# Patient Record
Sex: Female | Born: 1955 | Race: Black or African American | Hispanic: No | Marital: Single | Smoking: Never smoker
Health system: Southern US, Community
[De-identification: ages and names within clinical notes are randomized; demographics above are authoritative.]

## PROBLEM LIST (undated history)

## (undated) DIAGNOSIS — E785 Hyperlipidemia, unspecified: Secondary | ICD-10-CM

## (undated) DIAGNOSIS — I10 Essential (primary) hypertension: Secondary | ICD-10-CM

## (undated) HISTORY — PX: WISDOM TOOTH EXTRACTION: SHX21

## (undated) HISTORY — PX: REDUCTION MAMMAPLASTY: SUR839

## (undated) HISTORY — DX: Essential (primary) hypertension: I10

## (undated) HISTORY — DX: Hyperlipidemia, unspecified: E78.5

---

## 2005-01-25 ENCOUNTER — Ambulatory Visit: Payer: Self-pay | Admitting: Family Medicine

## 2005-01-29 ENCOUNTER — Ambulatory Visit: Payer: Self-pay | Admitting: Family Medicine

## 2007-06-05 ENCOUNTER — Ambulatory Visit: Payer: Self-pay | Admitting: Family Medicine

## 2008-09-05 ENCOUNTER — Ambulatory Visit: Payer: Self-pay | Admitting: Family Medicine

## 2008-11-22 ENCOUNTER — Ambulatory Visit: Payer: Self-pay | Admitting: Gastroenterology

## 2010-09-15 ENCOUNTER — Ambulatory Visit: Payer: Self-pay | Admitting: Family Medicine

## 2012-03-08 ENCOUNTER — Ambulatory Visit: Payer: Self-pay | Admitting: Family Medicine

## 2014-06-06 ENCOUNTER — Ambulatory Visit: Payer: Self-pay

## 2016-08-04 ENCOUNTER — Other Ambulatory Visit: Payer: Self-pay | Admitting: Family Medicine

## 2016-08-04 DIAGNOSIS — R221 Localized swelling, mass and lump, neck: Secondary | ICD-10-CM

## 2016-08-09 ENCOUNTER — Ambulatory Visit
Admission: RE | Admit: 2016-08-09 | Discharge: 2016-08-09 | Disposition: A | Discharge status: Home / Self Care | Payer: 59 | Source: Ambulatory Visit | Attending: Family Medicine | Admitting: Family Medicine

## 2016-08-09 DIAGNOSIS — R221 Localized swelling, mass and lump, neck: Secondary | ICD-10-CM

## 2016-08-09 DIAGNOSIS — E042 Nontoxic multinodular goiter: Secondary | ICD-10-CM | POA: Insufficient documentation

## 2016-09-16 ENCOUNTER — Other Ambulatory Visit: Payer: Self-pay | Admitting: Otolaryngology

## 2016-09-16 DIAGNOSIS — E041 Nontoxic single thyroid nodule: Secondary | ICD-10-CM

## 2016-09-21 ENCOUNTER — Ambulatory Visit
Admission: RE | Admit: 2016-09-21 | Discharge: 2016-09-21 | Disposition: A | Discharge status: Home / Self Care | Payer: 59 | Source: Ambulatory Visit | Attending: Otolaryngology | Admitting: Otolaryngology

## 2016-09-21 ENCOUNTER — Ambulatory Visit: Admission: RE | Admit: 2016-09-21 | Payer: 59 | Source: Ambulatory Visit

## 2016-09-21 DIAGNOSIS — I2510 Atherosclerotic heart disease of native coronary artery without angina pectoris: Secondary | ICD-10-CM | POA: Insufficient documentation

## 2016-09-21 DIAGNOSIS — E041 Nontoxic single thyroid nodule: Secondary | ICD-10-CM | POA: Diagnosis present

## 2016-09-27 ENCOUNTER — Inpatient Hospital Stay
Admission: RE | Admit: 2016-09-27 | Discharge: 2016-09-27 | Disposition: A | Discharge status: Home / Self Care | Payer: 59 | Source: Ambulatory Visit

## 2016-09-28 ENCOUNTER — Encounter: Payer: Self-pay | Admitting: *Deleted

## 2016-09-28 NOTE — Patient Instructions (Signed)
  Your procedure is scheduled on: 10-04-16 [facility]) Report to Same Day Surgery 2nd floor medical mall [facility] Entrance-take elevator on left to 2nd floor.  Check in with surgery information desk.) To find out your arrival time please call [phone] between 1PM - 3PM on 10-01-16 (FRIDAY)  Remember: Instructions that are not followed completely may result in serious medical risk, up to and including death, or upon the discretion of your surgeon and anesthesiologist your surgery may need to be rescheduled.    _x___ 1. Do not eat food or drink liquids after midnight. No gum chewing or hard candies.     __x__ 2. No Alcohol for 24 hours before or after surgery.   __x__3. No Smoking for 24 prior to surgery.   ____  4. Bring all medications with you on the day of surgery if instructed.    __x__ 5. Notify your doctor if there is any change in your medical condition     (cold, fever, infections).     Do not wear jewelry, make-up, hairpins, clips or nail polish.  Do not wear lotions, powders, or perfumes. You may wear deodorant.  Do not shave 48 hours prior to surgery. Men may shave face and neck.  Do not bring valuables to the hospital.    [facility] is not responsible for any belongings or valuables.               Contacts, dentures or bridgework may not be worn into surgery.  Leave your suitcase in the car. After surgery it may be brought to your room.  For patients admitted to the hospital, discharge time is determined by your treatment team.   Patients discharged the day of surgery will not be allowed to drive home.  You will need someone to drive you home and stay with you the night of your procedure.    Please read over the following fact sheets that you were given:   [facility] Preparing for Surgery and or MRSA Information   ____ Take these medicines the morning of surgery with A SIP OF WATER:    1. NONE  2.  3.  4.  5.  6.  ____Fleets enema or Magnesium Citrate as  directed.   _x___ Use CHG Soap or sage wipes as directed on instruction sheet   ____ Use inhalers on the day of surgery and bring to hospital day of surgery  ____ Stop metformin 2 days prior to surgery    ____ Take 1/2 of usual insulin dose the night before surgery and none on the morning of  surgery.   ____ Stop Aspirin, Coumadin, Pllavix ,Eliquis, Effient, or Pradaxa  x__ Stop Anti-inflammatories such as Advil, Aleve, Ibuprofen, Motrin, Naproxen,          Naprosyn, Goodies powders or aspirin products NOW- Ok to take Tylenol.   _X___ Stop supplements until after surgery-STOP BIOTIN AND COL LIVER OIL NOW  ____ Bring C-Pap to the hospital.

## 2016-09-30 ENCOUNTER — Encounter
Admission: RE | Admit: 2016-09-30 | Discharge: 2016-09-30 | Disposition: A | Discharge status: Home / Self Care | Payer: 59 | Source: Ambulatory Visit | Attending: Otolaryngology | Admitting: Otolaryngology

## 2016-09-30 DIAGNOSIS — E041 Nontoxic single thyroid nodule: Secondary | ICD-10-CM | POA: Insufficient documentation

## 2016-09-30 DIAGNOSIS — Z01812 Encounter for preprocedural laboratory examination: Secondary | ICD-10-CM | POA: Insufficient documentation

## 2016-09-30 LAB — BASIC METABOLIC PANEL
Anion gap: 8 (ref 5–15)
BUN: 10 mg/dL (ref 6–20)
CALCIUM: 9.4 mg/dL (ref 8.9–10.3)
CO2: 26 mmol/L (ref 22–32)
CREATININE: 0.79 mg/dL (ref 0.44–1.00)
Chloride: 108 mmol/L (ref 101–111)
GFR calc Af Amer: >60 mL/min (ref >60)
Glucose, Bld: 104 mg/dL — ABNORMAL HIGH (ref 65–99)
POTASSIUM: 3.8 mmol/L (ref 3.5–5.1)
SODIUM: 142 mmol/L (ref 135–145)

## 2016-10-04 ENCOUNTER — Observation Stay
Admission: RE | Admit: 2016-10-04 | Discharge: 2016-10-05 | Disposition: A | Discharge status: Home / Self Care | Payer: 59 | Source: Ambulatory Visit | Attending: Otolaryngology | Admitting: Otolaryngology

## 2016-10-04 ENCOUNTER — Encounter: Payer: Self-pay | Admitting: *Deleted

## 2016-10-04 ENCOUNTER — Encounter
Admission: RE | Disposition: A | Discharge status: Home / Self Care | Payer: Self-pay | Source: Ambulatory Visit | Attending: Otolaryngology

## 2016-10-04 ENCOUNTER — Ambulatory Visit: Payer: 59 | Admitting: Anesthesiology

## 2016-10-04 DIAGNOSIS — E042 Nontoxic multinodular goiter: Secondary | ICD-10-CM | POA: Insufficient documentation

## 2016-10-04 DIAGNOSIS — Z79899 Other long term (current) drug therapy: Secondary | ICD-10-CM | POA: Insufficient documentation

## 2016-10-04 DIAGNOSIS — E041 Nontoxic single thyroid nodule: Secondary | ICD-10-CM | POA: Diagnosis present

## 2016-10-04 DIAGNOSIS — Z9889 Other specified postprocedural states: Secondary | ICD-10-CM

## 2016-10-04 DIAGNOSIS — Z9089 Acquired absence of other organs: Secondary | ICD-10-CM

## 2016-10-04 DIAGNOSIS — E890 Postprocedural hypothyroidism: Secondary | ICD-10-CM

## 2016-10-04 DIAGNOSIS — C73 Malignant neoplasm of thyroid gland: Secondary | ICD-10-CM | POA: Diagnosis not present

## 2016-10-04 HISTORY — PX: THYROIDECTOMY: SHX17

## 2016-10-04 SURGERY — THYROIDECTOMY
Anesthesia: General

## 2016-10-04 MED ORDER — FENTANYL CITRATE (PF) 100 MCG/2ML IJ SOLN
INTRAMUSCULAR | Status: AC
  Filled 2016-10-04: qty 2

## 2016-10-04 MED ORDER — LIDOCAINE HCL (PF) 1 % IJ SOLN
INTRAMUSCULAR | Status: AC
  Filled 2016-10-04: qty 30

## 2016-10-04 MED ORDER — ONDANSETRON 4 MG PO TBDP: 4.0000 mg | ORAL_TABLET | Freq: Four times a day (QID) | ORAL | Status: DC | PRN

## 2016-10-04 MED ORDER — FAMOTIDINE 20 MG PO TABS
ORAL_TABLET | ORAL | Status: AC
  Administered 2016-10-04: 20 mg via ORAL
  Filled 2016-10-04: qty 1

## 2016-10-04 MED ORDER — PROPOFOL 500 MG/50ML IV EMUL
INTRAVENOUS | Status: DC | PRN
  Administered 2016-10-04: 50 ug/kg/min via INTRAVENOUS

## 2016-10-04 MED ORDER — MIDAZOLAM HCL 2 MG/2ML IJ SOLN
INTRAMUSCULAR | Status: DC | PRN
  Administered 2016-10-04: 2 mg via INTRAVENOUS

## 2016-10-04 MED ORDER — FENTANYL CITRATE (PF) 100 MCG/2ML IJ SOLN
INTRAMUSCULAR | Status: DC | PRN
  Administered 2016-10-04: 25 ug via INTRAVENOUS
  Administered 2016-10-04: 100 ug via INTRAVENOUS
  Administered 2016-10-04: 50 ug via INTRAVENOUS
  Administered 2016-10-04: 100 ug via INTRAVENOUS

## 2016-10-04 MED ORDER — PHENYLEPHRINE 40 MCG/ML (10ML) SYRINGE FOR IV PUSH (FOR BLOOD PRESSURE SUPPORT)
PREFILLED_SYRINGE | INTRAVENOUS | Status: AC
  Filled 2016-10-04: qty 10

## 2016-10-04 MED ORDER — PROPOFOL 10 MG/ML IV BOLUS
INTRAVENOUS | Status: AC
  Filled 2016-10-04: qty 40

## 2016-10-04 MED ORDER — FLEET ENEMA 7-19 GM/118ML RE ENEM: 1.0000 | ENEMA | Freq: Once | RECTAL | Status: DC | PRN

## 2016-10-04 MED ORDER — ACETAMINOPHEN 650 MG RE SUPP: 650.0000 mg | Freq: Four times a day (QID) | RECTAL | Status: DC | PRN

## 2016-10-04 MED ORDER — PROPOFOL 10 MG/ML IV BOLUS
INTRAVENOUS | Status: AC
  Filled 2016-10-04: qty 20

## 2016-10-04 MED ORDER — DEXAMETHASONE SODIUM PHOSPHATE 10 MG/ML IJ SOLN
INTRAMUSCULAR | Status: AC
  Filled 2016-10-04: qty 1

## 2016-10-04 MED ORDER — DEXAMETHASONE SODIUM PHOSPHATE 10 MG/ML IJ SOLN
INTRAMUSCULAR | Status: DC | PRN
  Administered 2016-10-04: 10 mg via INTRAVENOUS

## 2016-10-04 MED ORDER — ONDANSETRON HCL 4 MG/2ML IJ SOLN: 4.0000 mg | Freq: Once | INTRAMUSCULAR | Status: DC | PRN

## 2016-10-04 MED ORDER — REMIFENTANIL HCL 1 MG IV SOLR
INTRAVENOUS | Status: AC
  Filled 2016-10-04: qty 1000

## 2016-10-04 MED ORDER — ONDANSETRON HCL 4 MG/2ML IJ SOLN: 4.0000 mg | Freq: Four times a day (QID) | INTRAMUSCULAR | Status: DC | PRN

## 2016-10-04 MED ORDER — BACITRACIN 500 UNIT/GM EX OINT
TOPICAL_OINTMENT | CUTANEOUS | Status: DC | PRN
  Administered 2016-10-04: 1 via TOPICAL

## 2016-10-04 MED ORDER — LIDOCAINE HCL (CARDIAC) 20 MG/ML IV SOLN
INTRAVENOUS | Status: DC | PRN
Start: 2016-10-04 — End: 2016-10-04
  Administered 2016-10-04: 80 mg via INTRAVENOUS

## 2016-10-04 MED ORDER — HYDROCODONE-ACETAMINOPHEN 5-325 MG PO TABS: 1.0000 | ORAL_TABLET | ORAL | Status: DC | PRN

## 2016-10-04 MED ORDER — FENTANYL CITRATE (PF) 250 MCG/5ML IJ SOLN
INTRAMUSCULAR | Status: AC
  Filled 2016-10-04: qty 5

## 2016-10-04 MED ORDER — ATORVASTATIN CALCIUM 20 MG PO TABS
20.0000 mg | ORAL_TABLET | Freq: Every day | ORAL | Status: DC
  Administered 2016-10-04: 20 mg via ORAL
  Filled 2016-10-04: qty 1

## 2016-10-04 MED ORDER — ONDANSETRON HCL 4 MG/2ML IJ SOLN
INTRAMUSCULAR | Status: DC | PRN
  Administered 2016-10-04: 4 mg via INTRAVENOUS

## 2016-10-04 MED ORDER — FENTANYL CITRATE (PF) 100 MCG/2ML IJ SOLN
25.0000 ug | INTRAMUSCULAR | Status: DC | PRN
  Administered 2016-10-04: 25 ug via INTRAVENOUS

## 2016-10-04 MED ORDER — SODIUM CHLORIDE 0.9 % IJ SOLN
INTRAMUSCULAR | Status: AC
  Filled 2016-10-04: qty 20

## 2016-10-04 MED ORDER — PROPOFOL 10 MG/ML IV BOLUS
INTRAVENOUS | Status: AC
Start: 2016-10-04 — End: 2016-10-04
  Filled 2016-10-04: qty 20

## 2016-10-04 MED ORDER — FAMOTIDINE 20 MG PO TABS
20.0000 mg | ORAL_TABLET | Freq: Once | ORAL | Status: AC
  Administered 2016-10-04: 20 mg via ORAL

## 2016-10-04 MED ORDER — LACTATED RINGERS IV SOLN
INTRAVENOUS | Status: DC
  Administered 2016-10-04: 07:00:00 via INTRAVENOUS

## 2016-10-04 MED ORDER — BACITRACIN ZINC 500 UNIT/GM EX OINT
TOPICAL_OINTMENT | CUTANEOUS | Status: AC
  Filled 2016-10-04: qty 28.35

## 2016-10-04 MED ORDER — LIDOCAINE HCL (PF) 2 % IJ SOLN
INTRAMUSCULAR | Status: AC
  Filled 2016-10-04: qty 2

## 2016-10-04 MED ORDER — EPINEPHRINE PF 1 MG/ML IJ SOLN
INTRAMUSCULAR | Status: AC
  Filled 2016-10-04: qty 1

## 2016-10-04 MED ORDER — FENTANYL CITRATE (PF) 100 MCG/2ML IJ SOLN
INTRAMUSCULAR | Status: AC
  Administered 2016-10-04: 25 ug via INTRAVENOUS
  Filled 2016-10-04: qty 2

## 2016-10-04 MED ORDER — REMIFENTANIL HCL 1 MG IV SOLR
INTRAVENOUS | Status: DC | PRN
  Administered 2016-10-04: .08 ug/kg/min via INTRAVENOUS

## 2016-10-04 MED ORDER — ACETAMINOPHEN 325 MG PO TABS: 650.0000 mg | ORAL_TABLET | Freq: Four times a day (QID) | ORAL | Status: DC | PRN

## 2016-10-04 MED ORDER — DEXTROSE-NACL 5-0.45 % IV SOLN
INTRAVENOUS | Status: DC
  Administered 2016-10-04 (×2): via INTRAVENOUS

## 2016-10-04 MED ORDER — ONDANSETRON HCL 4 MG/2ML IJ SOLN
INTRAMUSCULAR | Status: AC
  Filled 2016-10-04: qty 2

## 2016-10-04 MED ORDER — MORPHINE SULFATE (PF) 4 MG/ML IV SOLN: 3.0000 mg | INTRAVENOUS | Status: DC | PRN

## 2016-10-04 MED ORDER — PROPOFOL 10 MG/ML IV BOLUS
INTRAVENOUS | Status: DC | PRN
  Administered 2016-10-04: 150 mg via INTRAVENOUS
  Administered 2016-10-04: 20 mg via INTRAVENOUS
  Administered 2016-10-04 (×2): 30 mg via INTRAVENOUS

## 2016-10-04 MED ORDER — SUCCINYLCHOLINE CHLORIDE 20 MG/ML IJ SOLN
INTRAMUSCULAR | Status: DC | PRN
  Administered 2016-10-04: 100 mg via INTRAVENOUS

## 2016-10-04 MED ORDER — MAGNESIUM HYDROXIDE 400 MG/5ML PO SUSP: 30.0000 mL | Freq: Every day | ORAL | Status: DC | PRN

## 2016-10-04 MED ORDER — MIDAZOLAM HCL 2 MG/2ML IJ SOLN
INTRAMUSCULAR | Status: AC
  Filled 2016-10-04: qty 2

## 2016-10-04 MED ORDER — BISACODYL 5 MG PO TBEC: 5.0000 mg | DELAYED_RELEASE_TABLET | Freq: Every day | ORAL | Status: DC | PRN

## 2016-10-04 MED ORDER — SUCCINYLCHOLINE CHLORIDE 200 MG/10ML IV SOSY
PREFILLED_SYRINGE | INTRAVENOUS | Status: AC
Start: 2016-10-04 — End: 2016-10-04
  Filled 2016-10-04: qty 10

## 2016-10-04 MED ORDER — PHENYLEPHRINE HCL 10 MG/ML IJ SOLN
INTRAMUSCULAR | Status: DC | PRN
  Administered 2016-10-04: 80 ug via INTRAVENOUS

## 2016-10-04 SURGICAL SUPPLY — 39 items
BLADE SURG 15 STRL LF DISP TIS (BLADE) ×2 IMPLANT
BLADE SURG 15 STRL SS (BLADE) ×4
CANISTER SUCT 1200ML W/VALVE (MISCELLANEOUS) ×3 IMPLANT
CORD BIP STRL DISP 12FT (MISCELLANEOUS) ×3 IMPLANT
DRAIN TLS ROUND 10FR (DRAIN) IMPLANT
DRAPE MAG INST 16X20 L/F (DRAPES) ×3 IMPLANT
DRSG TEGADERM 2-3/8X2-3/4 SM (GAUZE/BANDAGES/DRESSINGS) ×3 IMPLANT
DRSG TEGADERM 4X4.75 (GAUZE/BANDAGES/DRESSINGS) ×3 IMPLANT
DRSG TELFA 3X8 NADH (GAUZE/BANDAGES/DRESSINGS) ×3 IMPLANT
ELECT LARYNGEAL 6/7 (MISCELLANEOUS)
ELECT LARYNGEAL 8/9 (MISCELLANEOUS)
ELECT LARYNGEAL DUAL CHAN (ELECTRODE) ×3 IMPLANT
ELECT REM PT RETURN 9FT ADLT (ELECTROSURGICAL) ×3
ELECTRODE LARYNGEAL 6/7 (MISCELLANEOUS) IMPLANT
ELECTRODE LARYNGEAL 8/9 (MISCELLANEOUS) IMPLANT
ELECTRODE REM PT RTRN 9FT ADLT (ELECTROSURGICAL) ×1 IMPLANT
FORCEPS JEWEL BIP 4-3/4 STR (INSTRUMENTS) ×3 IMPLANT
GLOVE BIO SURGEON STRL SZ7.5 (GLOVE) ×9 IMPLANT
GLOVE PROTEXIS LATEX SZ 7.5 (GLOVE) ×3 IMPLANT
GOWN STRL REUS W/ TWL LRG LVL3 (GOWN DISPOSABLE) ×3 IMPLANT
GOWN STRL REUS W/TWL LRG LVL3 (GOWN DISPOSABLE) ×6
HARMONIC SCALPEL FOCUS (MISCELLANEOUS) ×3 IMPLANT
HEMOSTAT SURGICEL 2X3 (HEMOSTASIS) ×3 IMPLANT
HOOK STAY BLUNT/RETRACTOR 5M (MISCELLANEOUS) ×3 IMPLANT
LABEL OR SOLS (LABEL) IMPLANT
LARYNGEAL SURFACE ELECTRODE ×2 IMPLANT
NS IRRIG 500ML POUR BTL (IV SOLUTION) ×3 IMPLANT
PACK HEAD/NECK (MISCELLANEOUS) ×3 IMPLANT
PROBE NEUROSIGN BIPOL (MISCELLANEOUS) ×1 IMPLANT
PROBE NEUROSIGN BIPOLAR (MISCELLANEOUS) ×2
SPONGE KITTNER 5P (MISCELLANEOUS) ×3 IMPLANT
SPONGE XRAY 4X4 16PLY STRL (MISCELLANEOUS) ×3 IMPLANT
STRAP SAFETY BODY (MISCELLANEOUS) ×3 IMPLANT
SUT ETHILON 6 0 9-3 1X18 BLK (SUTURE) ×3 IMPLANT
SUT PROLENE 6 0 P 1 18 (SUTURE) ×3 IMPLANT
SUT SILK 2 0 (SUTURE)
SUT SILK 2-0 18XBRD TIE 12 (SUTURE) IMPLANT
SUT VIC AB 4-0 RB1 18 (SUTURE) IMPLANT
SYSTEM CHEST DRAIN TLS 7FR (DRAIN) ×9 IMPLANT

## 2016-10-04 NOTE — H&P (Signed)
  H&P has been reviewed and no changes necessary. To be downloaded later. 

## 2016-10-04 NOTE — Progress Notes (Signed)
10/04/2016 6:05pm  S: A little sore in neck, but otherwise feeling ok O: Neck wound looks dry, with no swelling. Voice clear, no SOB.       Drains ok, Swallowing liquids well.  A: Doing well postop. P: Will change vacutainers now. Will get some pain meds to her.       Wll see in am to pull drain and d/c home.   Krystal Bennett

## 2016-10-04 NOTE — Anesthesia Post-op Follow-up Note (Cosign Needed)
Anesthesia QCDR form completed.        

## 2016-10-04 NOTE — Transfer of Care (Signed)
Immediate Anesthesia Transfer of Care Note  Patient: Krystal Bennett  Procedure(s) Performed: Procedure(s): THYROIDECTOMY (N/A)  Patient Location: PACU  Anesthesia Type:General  Level of Consciousness: awake and patient cooperative  Airway & Oxygen Therapy: Patient Spontanous Breathing and Patient connected to face mask oxygen  Post-op Assessment: Report given to RN, Post -op Vital signs reviewed and stable and Patient moving all extremities X 4  Post vital signs: Reviewed and stable  Last Vitals:  Vitals:   10/04/16 0608 10/04/16 1004  BP: (!) 197/99 129/78  Pulse: 87 91  Resp: 18 (!) 6  Temp: 36.2 C 36.4 C    Last Pain:  Vitals:   10/04/16 0608  TempSrc: Tympanic         Complications: No apparent anesthesia complications

## 2016-10-04 NOTE — Anesthesia Procedure Notes (Signed)
Procedure Name: Intubation Date/Time: 10/04/2016 7:28 AM Performed by: Hedda Slade Pre-anesthesia Checklist: Patient identified, Emergency Drugs available, Suction available and Patient being monitored Patient Re-evaluated:Patient Re-evaluated prior to inductionOxygen Delivery Method: Circle system utilized Preoxygenation: Pre-oxygenation with 100% oxygen Intubation Type: IV induction Ventilation: Mask ventilation without difficulty Laryngoscope Size: Glidescope Grade View: Grade I Tube type: Oral Tube size: 7.0 mm Number of attempts: 1 Placement Confirmation: ETT inserted through vocal cords under direct vision,  positive ETCO2 and breath sounds checked- equal and bilateral Secured at: 20 cm Tube secured with: Tape

## 2016-10-04 NOTE — Anesthesia Preprocedure Evaluation (Signed)
Anesthesia Evaluation  Patient identified by MRN, date of birth, ID band Patient awake    Reviewed: Allergy & Precautions, NPO status , Patient's Chart, lab work & pertinent test results  Airway Mallampati: III  TM Distance: <3 FB     Dental  (+) Caps, Chipped   Pulmonary neg pulmonary ROS,    Pulmonary exam normal        Cardiovascular negative cardio ROS Normal cardiovascular exam     Neuro/Psych negative neurological ROS  negative psych ROS   GI/Hepatic negative GI ROS, Neg liver ROS,   Endo/Other  negative endocrine ROS  Renal/GU negative Renal ROS  negative genitourinary   Musculoskeletal negative musculoskeletal ROS (+)   Abdominal Normal abdominal exam  (+)   Peds negative pediatric ROS (+)  Hematology negative hematology ROS (+)   Anesthesia Other Findings   Reproductive/Obstetrics negative OB ROS                             Anesthesia Physical Anesthesia Plan  ASA: II  Anesthesia Plan: General   Post-op Pain Management:    Induction: Intravenous  Airway Management Planned: Oral ETT  Additional Equipment:   Intra-op Plan:   Post-operative Plan: Extubation in OR  Informed Consent: I have reviewed the patients History and Physical, chart, labs and discussed the procedure including the risks, benefits and alternatives for the proposed anesthesia with the patient or authorized representative who has indicated his/her understanding and acceptance.   Dental advisory given  Plan Discussed with: CRNA and Surgeon  Anesthesia Plan Comments:         Anesthesia Quick Evaluation

## 2016-10-04 NOTE — Op Note (Signed)
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 Superior right parathyroid was found and spared. Left inferior and superior parathyroids were found and spared, the superior 1 being larger than the others.  Procedure: The patient was brought to the operating room and given general anesthesia by oral endotracheal intubation. The endotracheal tube was wrapped with electrodes for continuous monitoring of the recurrent laryngeal nerve. This was placed under direct vision to make sure that the monitors were working well. The neck was then marked above pressure 1 fingerbreadth above the sternal notch and the marking was in a small skin crease. 3 mL of 1% Xylocaine with epi 1:100,000 were used for infiltration of the skin. She was prepped and draped sterile fashion.  Incision was made through the skin and subcutaneous down to the platysma. This was incised. The strap muscles were divided in the midline and she will bit towards left side because the large right thyroid nodule. The strap muscles were elevated over the right thyroid nodule to free up some the lateral border. There is large blood vessels that were in this area that were cut with the pneumonic scalpel. The superior pole was freed up and a small parathyroid gland was found and separated from the gland at superior pole. The superior blood vessels were cut with the Harmonic scalpel. This freed up some the superior pole and was able to rotate and medialized large nodule to rotated into the open wound and we'll see underneath it further. Recurrent laryngeal nerve was  found and was directly visualized. It stimulated well. Inferior attachments were then freed up of the gland until only the Berry's ligament was holding this to the trachea. There was made to free this up with the laryngeal nerve and direct vision it would not be injured. This freed up the remaining attachments of the right thyroid gland to the trachea. There had been a lot of fibrous tissue all around it typical of some thyroiditis. There is no significant bleeding.  The left side was then addressed and the strap muscles were elevated over the gland. There were small nodules in the gland and the lateral border was freed some. The superior gland was then no freed up and there was a fairly large parathyroid that was attached to the most superior pole. This was separated from the gland and left in the neck. The remaining superior attachments were freed up in the gland was now rotated. Inferiorly some vessels and a parathyroid were freed up gland and left in the wound. The recurrent laryngeal nerve was found as the gland was rotated and was tracked. It was separated from the gland and the Berry's ligament. The remaining attachments of the left thyroid gland to the trachea were freed and the entire thyroid gland was removed. The right superior pole was marked with a stitch the gland was sent for permanent section. The left recurrent laryngeal nerve stimulated well.  Both sides were irrigated well with saline and then Surgicel was placed over the recurrent laryngeal nerve area on both sides. Size 7 TLS  drains were placed into both wounds crisscrossing midline and placed to low continuous Vacutainer suction. The strap muscles were loosely closed the middle with 4-0 Vicryl. The platysma muscle was then closed with 4-0 Vicryl as well. The subcutaneous dermis was closed with 4-0 Vicryl and then the skin edges were held in apposition using a 6-0 Prolene in a running locking suture. The wound was covered with some  bacitracin followed by Telfa and Tegaderm. Drains were placed to suction and taken to the shoulder. The patient tolerated the procedure well. She was awakened taken to the recovery room in satisfactory condition. There were no operative complications.  She'll be transferred to the floor to be observed overnight.   Plan:  Huey Romans  10/04/2016 9:55 AM

## 2016-10-05 ENCOUNTER — Encounter: Payer: Self-pay | Admitting: Otolaryngology

## 2016-10-05 DIAGNOSIS — C73 Malignant neoplasm of thyroid gland: Secondary | ICD-10-CM | POA: Diagnosis not present

## 2016-10-05 LAB — CALCIUM: Calcium: 8.5 mg/dL — ABNORMAL LOW (ref 8.9–10.3)

## 2016-10-05 NOTE — Progress Notes (Signed)
Pt d/c to home today. IV removed intact.  Rx's given to pt w/all questions and concerns addressed.  D/C paperwork reviewed and education provided with all questions and concerns addressed.  Pt family at bedside for home transport.  Volunteer services contact for transportation from room to exit.    

## 2016-10-05 NOTE — Anesthesia Postprocedure Evaluation (Signed)
Anesthesia Post Note  Patient: Krystal Bennett  Procedure(s) Performed: Procedure(s) (LRB): THYROIDECTOMY (N/A)  Patient location during evaluation: PACU Anesthesia Type: General Level of consciousness: awake and alert and oriented Pain management: pain level controlled Vital Signs Assessment: post-procedure vital signs reviewed and stable Respiratory status: spontaneous breathing Cardiovascular status: blood pressure returned to baseline Anesthetic complications: no     Last Vitals:  Vitals:   10/05/16 0008 10/05/16 0510  BP: 115/72 120/66  Pulse: 80 72  Resp: 17 17  Temp: 36.9 C 36.8 C    Last Pain:  Vitals:   10/05/16 0510  TempSrc: Oral  PainSc:                  ,

## 2016-10-05 NOTE — Discharge Summary (Signed)
Physician Discharge Summary  Patient ID: Krystal Bennett MRN: [id number] DOB/AGE: 09/21/1955 61 y.o.  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 She had no complications with her surgery and postoperatively did well. She is required very little pain medication. Her voice is clear and she is breathing easily. Her wound looks excellent and the drains were removed this morning. She is discharged home today to be followed up in the office in 6 days for suture removal and to go over the path report.  Consults: None  Significant Diagnostic Studies: labs: Awaiting calcium level results  Treatments: surgery: Total thyroidectomy  Discharge Exam: Blood pressure 120/66, pulse 72, temperature 98.3 F (36.8 C), temperature source Oral, resp. rate 17, height 5\' 6"  (1.676 m), weight 77.1 kg (170 lb), SpO2 100 %. Her wound looks great and the dressing is changed. Drains were removed.  Disposition: Final discharge disposition not confirmed  Discharge Instructions    Call MD for:  difficulty breathing, headache or visual disturbances    Complete by:  As directed    Call MD for:  hives    Complete by:  As directed    Call MD for:  persistant nausea and vomiting    Complete by:  As directed    Call MD for:  redness, tenderness, or signs of infection (pain, swelling, redness, odor or green/yellow discharge around incision site)    Complete by:  As directed    Call MD for:  temperature >100.4    Complete by:  As directed    Diet general    Complete by:  As directed    Increase activity slowly    Complete by:  As directed      Allergies as of 10/05/2016   No Known Allergies     Medication List    TAKE these medications   atorvastatin 20 MG tablet Commonly  known as:  LIPITOR Take 1 tablet by mouth daily at 6 PM.   Biotin 5 MG Tabs Take 1 tablet by mouth daily.   cholecalciferol 1000 units tablet Commonly known as:  VITAMIN D Take 1,000 Units by mouth daily.   COD LIVER OIL PO Take 1 tablet by mouth daily.        Signed: , H 10/05/2016, 7:44 AM

## 2016-10-08 LAB — SURGICAL PATHOLOGY

## 2016-12-13 ENCOUNTER — Other Ambulatory Visit: Payer: Self-pay | Admitting: Family Medicine

## 2016-12-13 DIAGNOSIS — Z1231 Encounter for screening mammogram for malignant neoplasm of breast: Secondary | ICD-10-CM

## 2016-12-27 ENCOUNTER — Ambulatory Visit: Admission: RE | Admit: 2016-12-27 | Payer: 59 | Source: Ambulatory Visit

## 2017-08-08 ENCOUNTER — Other Ambulatory Visit: Payer: Self-pay | Admitting: Family Medicine

## 2017-08-08 DIAGNOSIS — Z1231 Encounter for screening mammogram for malignant neoplasm of breast: Secondary | ICD-10-CM

## 2017-08-08 DIAGNOSIS — Z0000 Encounter for general adult medical examination without abnormal findings: Secondary | ICD-10-CM

## 2018-05-10 IMAGING — CT CT NECK W/O CM
4 of 5 series · 15 of 33 positions shown, 18 images · non-contrast
Comparison: None.

CLINICAL DATA: Pre thyroid surgery.  Sore throat for several weeks.

EXAM:
CT NECK WITHOUT CONTRAST
TECHNIQUE: Multidetector CT imaging of the neck was performed following the
standard protocol without intravenous contrast.

[Series 2: axial neck · axial · 0.51mm/px · z∈[-316,-252]mm · 2 of 129 slices shown]
[im 33/129  bone]
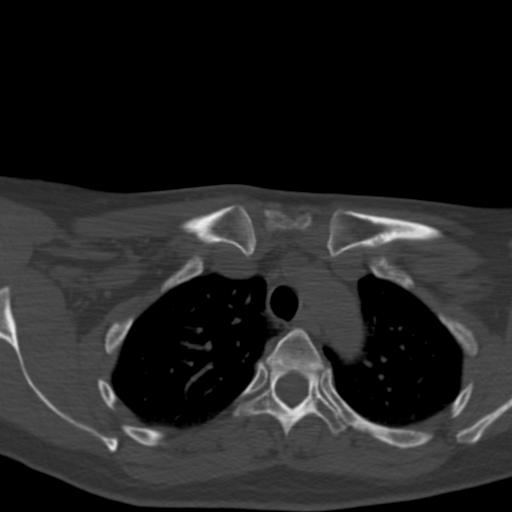
[im 65/129  bone]
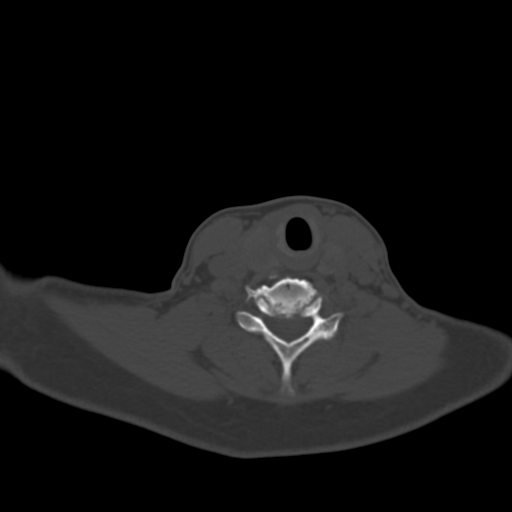

[Series 602: coronal · coronal · 0.51mm/px · 3 of 109 slices shown]
[im 28/109  bone]
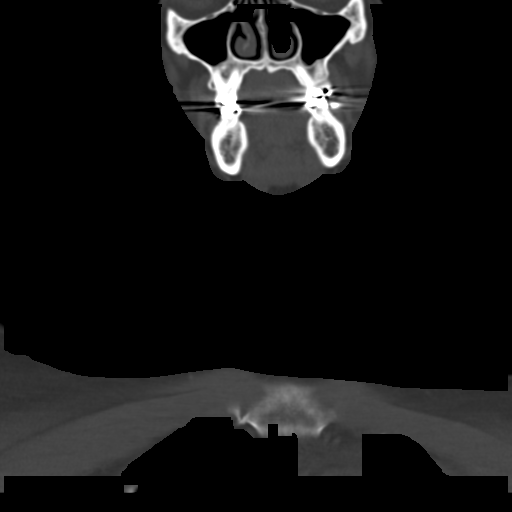
[im 46/109  bone]
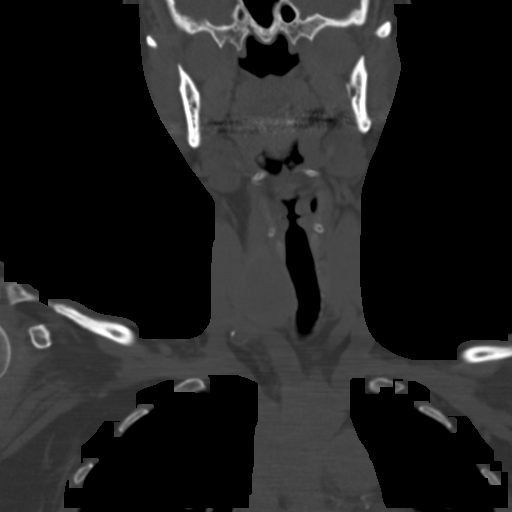
[im 64/109  bone]
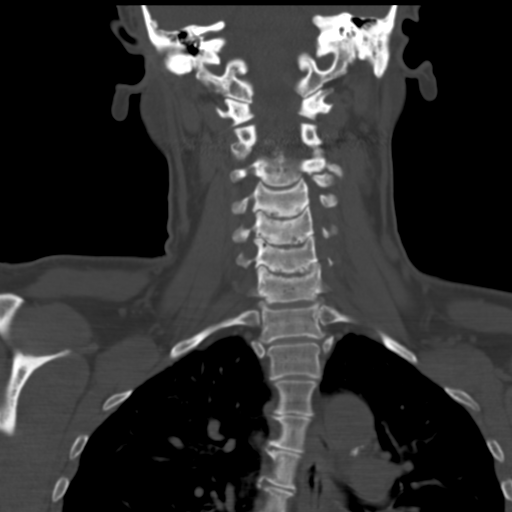

[Series 603: sagittal · sagittal · 0.51mm/px · 5 of 99 slices shown, 6 images]
[im 33/99  bone]
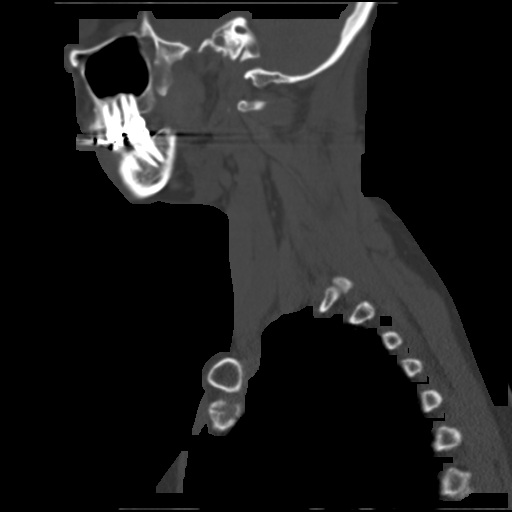
[im 41/99  bone]
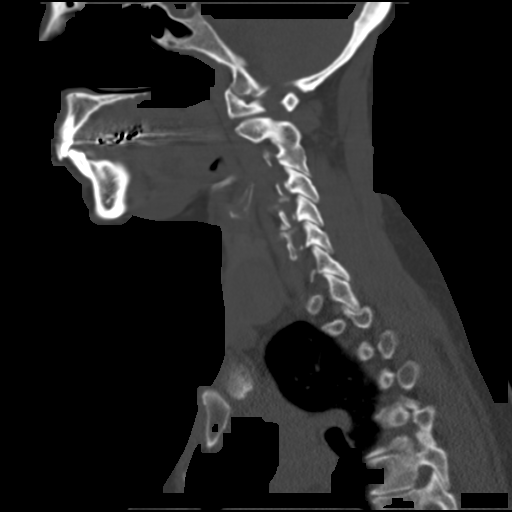
[im 50/99  soft-tissue]
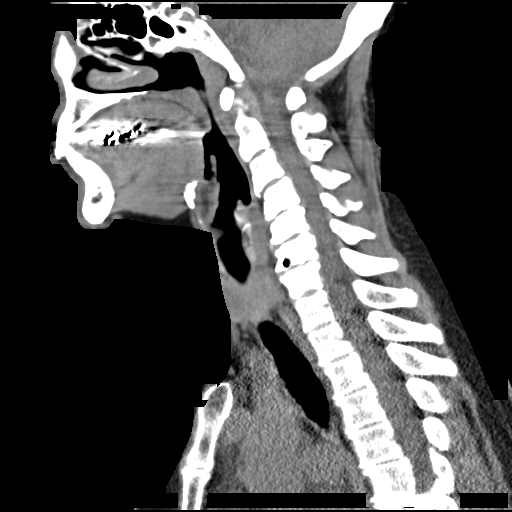
[im 50/99  bone]
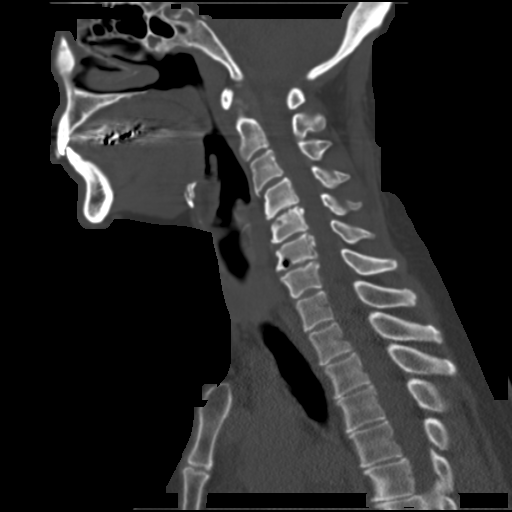
[im 58/99  bone]
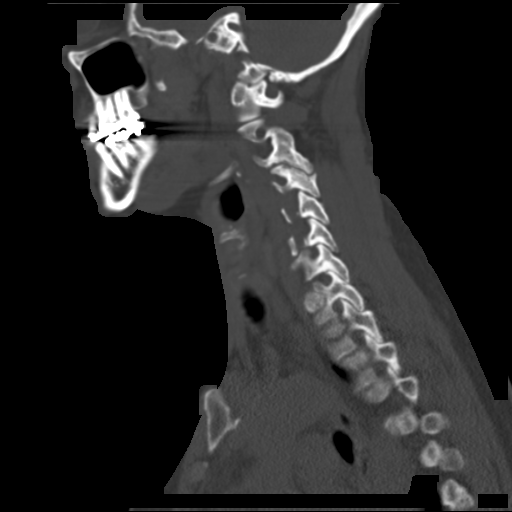
[im 66/99  bone]
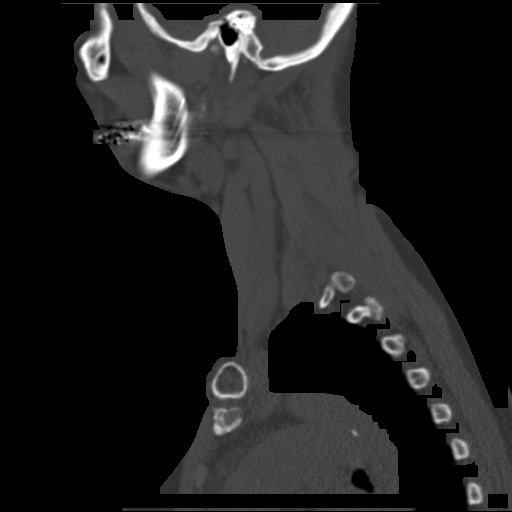

[Series 604: oropharynx · axial · 0.51mm/px · z∈[-383,-187]mm · 5 of 206 slices shown, 7 images]
[im 35/206  soft-tissue]
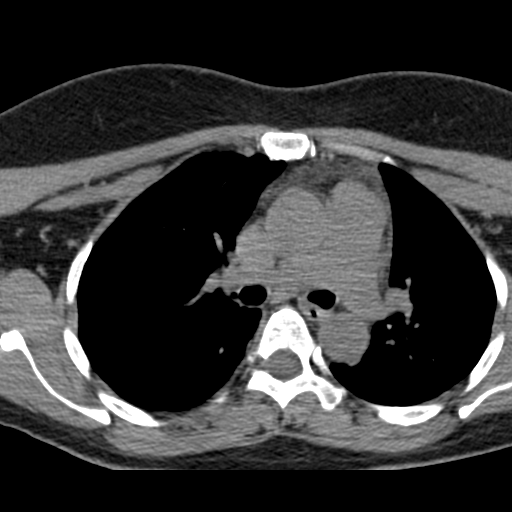
[im 35/206  bone]
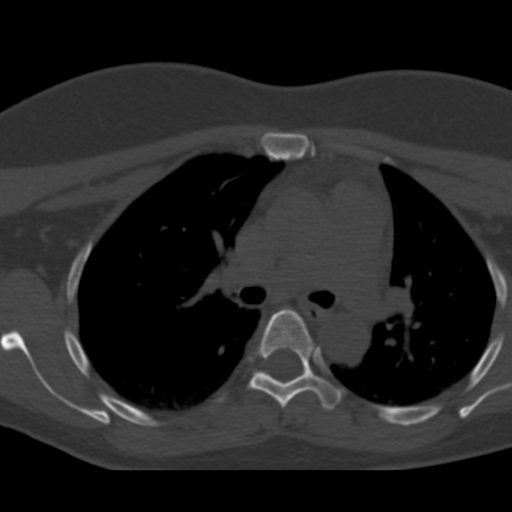
[im 69/206  bone]
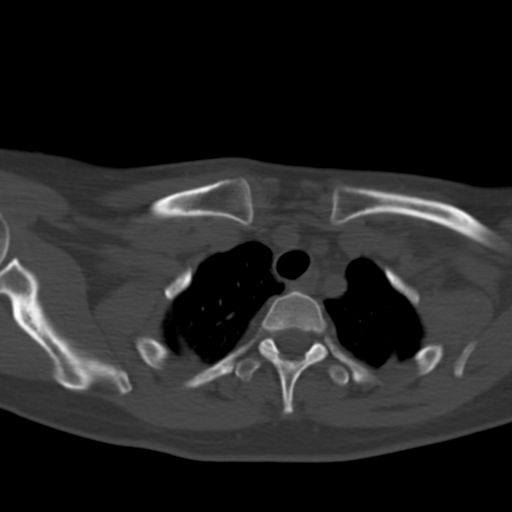
[im 103/206  bone]
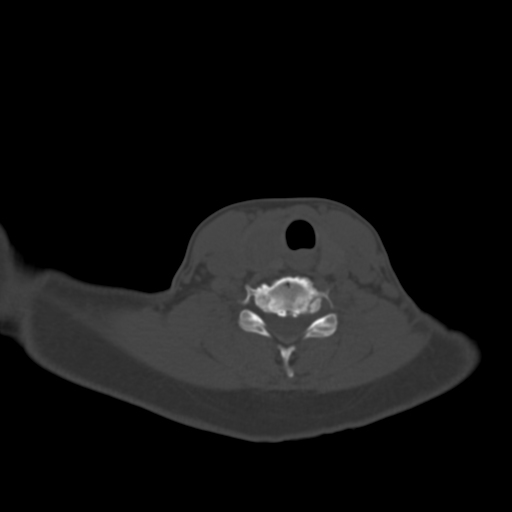
[im 137/206  bone]
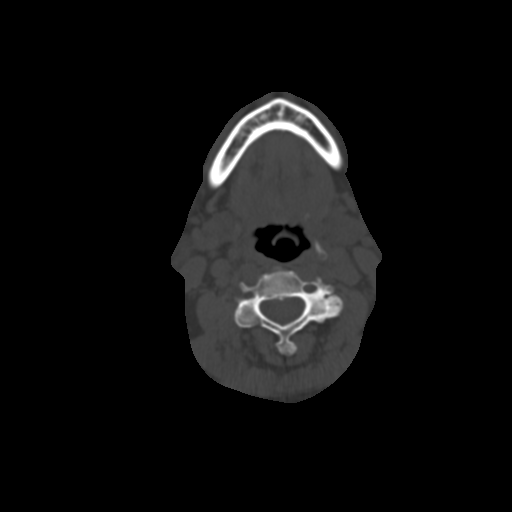
[im 171/206  soft-tissue]
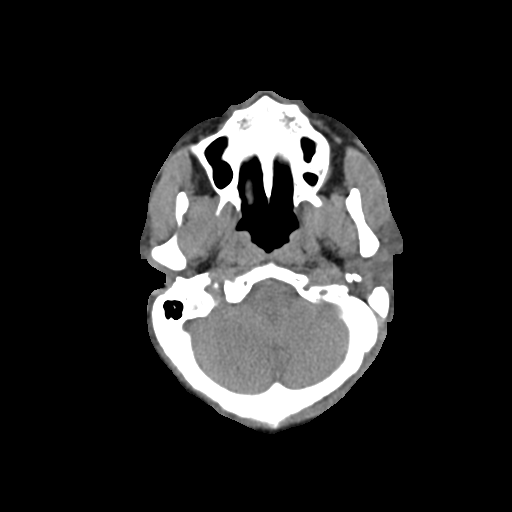
[im 171/206  bone]
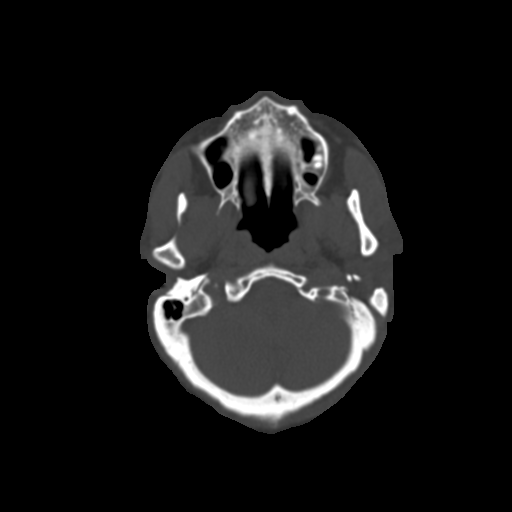

[15 of 33 positions shown; findings below may reference images not displayed]

FINDINGS: Pharynx and larynx: No evidence of mass or swelling. No detected
paralysis of the right vocal cord.

Salivary glands: Negative

Thyroid: Bilateral thyroid nodules as seen on recent thyroid
sonography. Dominant nodule in the right thyroid which is solid by
sonography and measures up to 3.8 cm. Where not enlarged by nodule
the gland is not thickened. No surrounding edema for thyroiditis in
this patient with neck pain. The trachea is mildly deviated the left
without narrowing or signs of invasion.

Lymph nodes: None enlarged or abnormal density.

Vascular: Atherosclerotic calcification that is overall mild.

Limited intracranial: Negative

Visualized orbits: Negative

Mastoids and visualized paranasal sinuses: Clear

Skeleton: Prominent degenerative changes for age, especially left
C3-4 facet arthropathy and C4-5 to C6-7 disc degeneration with
spurring.

Upper chest: No visualized pulmonary nodule.
IMPRESSION: Nodular thyroid, most notably a dominant 
4 cm right lobe nodule.
No inflammatory or invasive features. Negative for cervical
lymphadenopathy.

## 2018-10-18 ENCOUNTER — Other Ambulatory Visit: Payer: Self-pay | Admitting: Gerontology

## 2020-04-16 ENCOUNTER — Other Ambulatory Visit: Payer: Self-pay | Admitting: Gerontology

## 2020-04-16 DIAGNOSIS — Z13820 Encounter for screening for osteoporosis: Secondary | ICD-10-CM

## 2020-04-16 DIAGNOSIS — Z1231 Encounter for screening mammogram for malignant neoplasm of breast: Secondary | ICD-10-CM

## 2021-01-08 ENCOUNTER — Other Ambulatory Visit: Payer: Self-pay | Admitting: Gerontology

## 2021-01-08 DIAGNOSIS — Z1231 Encounter for screening mammogram for malignant neoplasm of breast: Secondary | ICD-10-CM

## 2021-08-07 DIAGNOSIS — Z23 Encounter for immunization: Secondary | ICD-10-CM | POA: Diagnosis not present

## 2022-07-19 ENCOUNTER — Other Ambulatory Visit: Payer: Self-pay | Admitting: Gerontology

## 2022-07-19 DIAGNOSIS — Z1231 Encounter for screening mammogram for malignant neoplasm of breast: Secondary | ICD-10-CM

## 2022-07-21 ENCOUNTER — Ambulatory Visit (LOCAL_COMMUNITY_HEALTH_CENTER): Payer: Medicare Other

## 2022-07-21 DIAGNOSIS — Z23 Encounter for immunization: Secondary | ICD-10-CM

## 2022-07-21 DIAGNOSIS — Z719 Counseling, unspecified: Secondary | ICD-10-CM

## 2022-07-21 NOTE — Progress Notes (Signed)
  Are you feeling sick today? No   Have you ever received a dose of COVID-19 Vaccine? [employer], [place], [place], [place], Other) Yes  If yes, which vaccine and how many doses?   PFIZER, 4   Did you bring the vaccination record card or other documentation?  Yes   Do you have a health condition or are undergoing treatment that makes you moderately or severely immunocompromised? This would include, but not be limited to: cancer, HIV, organ transplant, immunosuppressive therapy/high-dose corticosteroids, or moderate/severe primary immunodeficiency.  No  Have you received COVID-19 vaccine before or during hematopoietic cell transplant (HCT) or CAR-T-cell therapies? No  Have you ever had an allergic reaction to: (This would include a severe allergic reaction or a reaction that caused hives, swelling, or respiratory distress, including wheezing.) A component of a COVID-19 vaccine or a previous dose of COVID-19 vaccine? No   Have you ever had an allergic reaction to another vaccine (other thanCOVID-19 vaccine) or an injectable medication? (This would include a severe allergic reaction or a reaction that caused hives, swelling, or respiratory distress, including wheezing.)   No    Do you have a history of any of the following:  Myocarditis or Pericarditis No  Dermal fillers:  No  Multisystem Inflammatory Syndrome (MIS-C or MIS-A)? No  COVID-19 disease within the past 3 months? No  Vaccinated with monkeypox vaccine in the last 4 weeks? No  Eligible, administered [place] [phone], monitored, tolerated well. Provided VIS and NCIR. M., LPN.

## 2022-12-23 ENCOUNTER — Ambulatory Visit
Admission: RE | Admit: 2022-12-23 | Discharge: 2022-12-23 | Disposition: A | Discharge status: Home / Self Care | Payer: Medicare HMO | Source: Ambulatory Visit | Attending: Gerontology | Admitting: Gerontology

## 2022-12-23 DIAGNOSIS — Z1231 Encounter for screening mammogram for malignant neoplasm of breast: Secondary | ICD-10-CM | POA: Insufficient documentation

## 2022-12-28 ENCOUNTER — Other Ambulatory Visit: Payer: Self-pay | Admitting: Gerontology

## 2022-12-28 DIAGNOSIS — R921 Mammographic calcification found on diagnostic imaging of breast: Secondary | ICD-10-CM

## 2022-12-28 DIAGNOSIS — R928 Other abnormal and inconclusive findings on diagnostic imaging of breast: Secondary | ICD-10-CM

## 2023-01-03 ENCOUNTER — Ambulatory Visit
Admission: RE | Admit: 2023-01-03 | Discharge: 2023-01-03 | Disposition: A | Discharge status: Home / Self Care | Payer: Medicare HMO | Source: Ambulatory Visit | Attending: Gerontology | Admitting: Gerontology

## 2023-01-03 DIAGNOSIS — R928 Other abnormal and inconclusive findings on diagnostic imaging of breast: Secondary | ICD-10-CM

## 2023-01-03 DIAGNOSIS — R921 Mammographic calcification found on diagnostic imaging of breast: Secondary | ICD-10-CM

## 2023-01-10 ENCOUNTER — Other Ambulatory Visit: Payer: Self-pay | Admitting: Gerontology

## 2023-01-10 DIAGNOSIS — R928 Other abnormal and inconclusive findings on diagnostic imaging of breast: Secondary | ICD-10-CM

## 2023-01-10 DIAGNOSIS — R921 Mammographic calcification found on diagnostic imaging of breast: Secondary | ICD-10-CM

## 2023-02-03 ENCOUNTER — Ambulatory Visit
Admission: RE | Admit: 2023-02-03 | Discharge: 2023-02-03 | Disposition: A | Discharge status: Home / Self Care | Payer: Medicare HMO | Source: Ambulatory Visit | Attending: Gerontology | Admitting: Gerontology

## 2023-02-03 DIAGNOSIS — R928 Other abnormal and inconclusive findings on diagnostic imaging of breast: Secondary | ICD-10-CM

## 2023-02-03 DIAGNOSIS — R921 Mammographic calcification found on diagnostic imaging of breast: Secondary | ICD-10-CM

## 2023-02-03 HISTORY — PX: BREAST BIOPSY: SHX20

## 2023-02-03 MED ORDER — LIDOCAINE HCL 1 % IJ SOLN
10.0000 mL | Freq: Once | INTRAMUSCULAR | Status: AC
  Administered 2023-02-03: 10 mL via INTRADERMAL
  Filled 2023-02-03: qty 10

## 2023-02-03 MED ORDER — LIDOCAINE-EPINEPHRINE 1 %-1:100000 IJ SOLN
20.0000 mL | Freq: Once | INTRAMUSCULAR | Status: AC
  Administered 2023-02-03: 20 mL via INTRADERMAL
  Filled 2023-02-03: qty 20

## 2023-02-04 LAB — SURGICAL PATHOLOGY

## 2023-02-07 ENCOUNTER — Encounter: Payer: Self-pay | Admitting: *Deleted

## 2023-02-07 NOTE — Progress Notes (Signed)
Received referral for newly diagnosed breast cancer from [facility].  Navigation initiated.  She will look up medical oncologists and surgeons and decide who she would like to see.  I will call her back on Wednesday to see which MD's she chooses.

## 2023-02-09 ENCOUNTER — Encounter: Payer: Self-pay | Admitting: *Deleted

## 2023-02-09 DIAGNOSIS — D0511 Intraductal carcinoma in situ of right breast: Secondary | ICD-10-CM

## 2023-02-09 NOTE — Progress Notes (Signed)
Krystal Bennett has decided she would like to see Dr. [name] and Dr. Aleen Campi.   Referral placed to [facility], she will see Dr. [name] tomorrow at 11.

## 2023-02-10 ENCOUNTER — Inpatient Hospital Stay: Payer: Medicare HMO | Attending: Internal Medicine | Admitting: Internal Medicine

## 2023-02-10 ENCOUNTER — Encounter: Payer: Self-pay | Admitting: Internal Medicine

## 2023-02-10 ENCOUNTER — Encounter: Payer: Self-pay | Admitting: *Deleted

## 2023-02-10 ENCOUNTER — Inpatient Hospital Stay: Payer: Medicare HMO

## 2023-02-10 ENCOUNTER — Other Ambulatory Visit: Payer: Medicare HMO

## 2023-02-10 VITALS — BP 146/88 | HR 66 | Temp 97.4°F | Wt 160.3 lb

## 2023-02-10 DIAGNOSIS — I10 Essential (primary) hypertension: Secondary | ICD-10-CM | POA: Insufficient documentation

## 2023-02-10 DIAGNOSIS — E039 Hypothyroidism, unspecified: Secondary | ICD-10-CM | POA: Insufficient documentation

## 2023-02-10 DIAGNOSIS — E785 Hyperlipidemia, unspecified: Secondary | ICD-10-CM | POA: Insufficient documentation

## 2023-02-10 DIAGNOSIS — Z801 Family history of malignant neoplasm of trachea, bronchus and lung: Secondary | ICD-10-CM | POA: Insufficient documentation

## 2023-02-10 DIAGNOSIS — D0510 Intraductal carcinoma in situ of unspecified breast: Secondary | ICD-10-CM | POA: Insufficient documentation

## 2023-02-10 DIAGNOSIS — Z8042 Family history of malignant neoplasm of prostate: Secondary | ICD-10-CM | POA: Diagnosis not present

## 2023-02-10 DIAGNOSIS — D0511 Intraductal carcinoma in situ of right breast: Secondary | ICD-10-CM | POA: Diagnosis present

## 2023-02-10 NOTE — Progress Notes (Signed)
Accompanied patient to initial medical oncology appointment.   Reviewed Breast Cancer treatment handbook.   Care plan summary given to patient.   Reviewed outreach programs and cancer center services.   

## 2023-02-10 NOTE — Progress Notes (Signed)
[facility] CONSULT NOTE  Patient Care Team: [facility] as PCP - General Hulen Luster, RN as Oncology Nurse Navigator  REFERRING PROVIDER: Laren Everts  REASON FOR REFFERAL: Right breast DCIS  CANCER STAGING   Cancer Staging  DCIS (ductal carcinoma in situ) Staging form: Breast, AJCC 8th Edition - Clinical stage from 02/03/2023: Stage 0 (cTis (DCIS), cN0, cM0, G3, ER: Unknown, PR: Unknown) - Signed by Michaelyn Barter, MD on 02/10/2023 Stage prefix: Initial diagnosis Nuclear grade: G3 Histologic grading system: 3 grade system   ASSESSMENT & PLAN:  Krystal Bennett 67 y.o. 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  # Right breast DCIS -Detected on screening mammogram.  A span of 6.5 cm suspicious calcifications present in the medial right breast.  2 areas were biopsied right posterior upper inner quadrant and right medial retroareolar.  For both pathology showing high-grade DCIS comedo type necrosis.  ER testing deferred to excisional specimen.  -I discussed these findings with the patient and further management.  Goal of treatment is curative.  She is scheduled to see Dr. Aleen Campi on June 17 to further discuss about surgical options of lumpectomy versus mastectomy.  If lumpectomy is an option and she decides to proceed with that, we will make a referral to radiation oncology.  ER receptor testing will be performed on the excisional specimen.  If hormone positive, there is a role of aromatase inhibitor such as anastrozole 1 mg daily for 5 years.  Side effects such as mood changes, hot flashes, increased cholesterol, osteoporosis, muscle or joint aches and pains were discussed.  References-  Based on NSABP B-35 trial, 3100  patients were randomly assigned to treatment with tamoxifen or anastrozole for five years. At a median follow-up of nine years, when compared with tamoxifen, anastrozole resulted in: a lower incidence of subsequent breast cancer events (ie, recurrent DCIS or subsequent invasive breast cancer) (HR 0.73), including a lower rate of invasive breast cancer (HR 0.62) and Improved estimated breast cancer-free survival at 10 years. There is no difference in OS.   Side effects were discussed including but not limited to myalgias, arthralgias, mood changes, hot flashes, thinning of bone. Patient reports she had DEXA scan in past 2 years in [place]. Our coordinator will look in to it.   - Based on NSABP B24, discussed role of tamoxifen 20 mg daily x 5 years. At 10 years of follow-up, patients with ER-positive DCIS treated with tamoxifen had significant decreases in any subsequent (invasive and/or noninvasive; ipsilateral and/or contralateral) breast cancer events compared with patients receiving placebo (HR 0.58). Side effects such as mood changes, hot flashes, increased risk of blood clot and small risk of endometrial cancer was discussed.   - TAM-01 data with role of low dose tamoxifen 5 mg daily x 3 years discussed. The TAM-01 population included 500 women (20% atypical ductal hyperplasia, 11% LCIS, and 69% DCIS). There was a significant 50% reduction of recurrence with tamoxifen in the DCIS cohort, which represents 70% of the overall population  The relative risk reduction with tamoxifen in this trial is similar to that in trials that used a higher dose of tamoxifen, but the rate of severe toxicity relative to placebo is less. However, a limitation of this trial was that treatment adherence was only about 60 percent  in either arm. We discussed that efficacy data supporting lower-dose tamoxifen are limited to a single trial that did not compare it with standard dose treatment.   In the NSABP B-24 trial, adjuvant  tamoxifen at 20 mg for 5 years after breast-conserving surgery plus whole breast irradiation for DCIS resulted in a 32% relative reduction in the risk of local recurrence (from 9% to 6.6%) and a 53% relative reduction in the risk of contralateral disease over 15 years. 170 Pre- and postmenopausal women were included. The benefit of tamoxifen was seen only in ER-positive DCIS and was not associated with improved OS. In the NSABP B-35 trial of postmenopausal women who received breast conservation therapy for ER-positive DCIS, adjuvant anastrozole was associated with a small improvement in breast cancer-free interval at 10 years versus tamoxifen (93.5% vs. 89.2%), with the benefit mainly limited to women younger than 60 years. Therefore, both adjuvant tamoxifen and anastrozole are appropriate options for postmenopausal women with DCIS, and considerations regarding toxicities should influence treatment choices for individual women. There is no role for endocrine therapy for risk reduction in women who undergo bilateral mastectomy. Women who have unilateral mastectomy for ER-positive DCIS may consider endocrine therapy for prevention of contralateral breast cancer . -I also discussed about genetic testing.  Patient will think about it and let us know if she is interested.  -I will follow-up with her in 2 weeks after the surgery to discuss the pathology results and further about endocrine therapy.  No orders of the defined types were placed in this encounter.  The total time spent in the appointment was 55 minutes encounter with patients including review of chart and various tests results, discussions about plan of care and coordination of care plan   All questions were answered. The patient knows to call the clinic with any problems, questions or concerns. No barriers to learning was detected.  Michaelyn Barter, MD 6/6/20241:23 PM   HISTORY OF PRESENTING ILLNESS:  LEILANIE Bennett 67 y.o. 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  I have reviewed her chart and materials related to her cancer extensively and collaborated history with the patient. Summary of oncologic history is as follows: Oncology History  DCIS (ductal carcinoma in situ)  12/23/2022 Mammogram   Screening mammogram FINDINGS: In the right breast, calcifications warrant further evaluation with magnified views. In the left breast, no findings suspicious for malignancy.  Diagnostic mammogram IMPRESSION: Suspicious calcifications in the medial right breast spanning 6.5 cm.   02/03/2023 Cancer Staging   Staging form: Breast, AJCC 8th Edition - Clinical stage from 02/03/2023: Stage 0 (cTis (DCIS), cN0, cM0, G3, ER: Unknown, PR: Unknown) - Signed by Michaelyn Barter, MD on 02/10/2023 Stage prefix: Initial diagnosis Nuclear grade: G3 Histologic grading system: 3 grade system   02/03/2023 Initial Biopsy   DIAGNOSIS:  A. BREAST CALCIFICATIONS, RIGHT POSTERIOR UPPER INNER QUADRANT;  STEREOTACTIC BIOPSY:  - HIGH-GRADE DUCTAL CARCINOMA IN SITU (DCIS), COMEDO-TYPE WITH  CALCIFICATIONS.   B. BREAST CALCIFICATIONS, RIGHT MEDIAL RETROAREOLAR; STEREOTACTIC  BIOPSY:  - HIGH-GRADE DUCTAL CARCINOMA IN SITU (DCIS), COMEDO-TYPE WITH  CALCIFICATIONS.   Comment:  Testing for estrogen receptor is deferred to an excision specimen.    02/10/2023 Initial Diagnosis   DCIS (ductal carcinoma in situ)    Menarche-age 77 or 17 Children 2 son Age at  first birth 57 Use of OCP more than 5 years Menopause age 28s HRT no History of breast biopsies normal Family history of prostate cancer in father and lung cancer in mother.  MEDICAL HISTORY:  History reviewed. No pertinent past medical  history.  SURGICAL HISTORY: Past Surgical History:  Procedure Laterality Date   BREAST BIOPSY Right 02/03/2023   stereo calcs #1 posterior group, coil marker, path pending   BREAST BIOPSY Right 02/03/2023   stereo bx calcs #2 anterior group, x marker, path pending   BREAST BIOPSY Right 02/03/2023   MM RT BREAST BX W LOC DEV EA AD LESION IMG BX SPEC STEREO GUIDE 02/03/2023 [facility]   BREAST BIOPSY Right 02/03/2023   MM RT BREAST BX W LOC DEV 1ST LESION IMAGE BX SPEC STEREO GUIDE 02/03/2023 [facility]   CESAREAN SECTION     THYROIDECTOMY N/A 10/04/2016   Procedure: THYROIDECTOMY;  Surgeon: Vernie Murders, MD;  Location: [facility];  Service: ENT;  Laterality: N/A;   WISDOM TOOTH EXTRACTION      SOCIAL HISTORY: Social History   Socioeconomic History   Marital status: Single    Spouse name: Not on file   Number of children: Not on file   Years of education: Not on file   Highest education level: Not on file  Occupational History   Not on file  Tobacco Use   Smoking status: Never   Smokeless tobacco: Never  Substance and Sexual Activity   Alcohol use: Yes    Comment: OCC WINE   Drug use: No   Sexual activity: Not on file  Other Topics Concern   Not on file  Social History Narrative   Not on file   Social Determinants of Health   Financial Resource Strain: Not on file  Food Insecurity: No Food Insecurity (02/10/2023)   Hunger Vital Sign    Worried About Running Out of Food in the Last Year: Never true    Ran Out of Food in the Last Year: Never true  Transportation Needs: No Transportation Needs (02/10/2023)   PRAPARE - Administrator, Civil Service (Medical): No    Lack of Transportation (Non-Medical): No  Physical Activity: Not on file  Stress: Not on file  Social Connections: Not on file  Intimate Partner Violence: Not At Risk (02/10/2023)   Humiliation, Afraid, Rape, and Kick questionnaire    Fear of Current or Ex-Partner: No    Emotionally Abused:  No    Physically Abused: No    Sexually Abused: No    FAMILY HISTORY: History reviewed. No pertinent family history.  ALLERGIES:  has No Known Allergies.  MEDICATIONS:  Current Outpatient Medications  Medication Sig Dispense Refill   atorvastatin (LIPITOR) 20 MG tablet Take 1 tablet by mouth daily at 6 PM.      Biotin 5 MG TABS Take 1 tablet by mouth daily.     Calcium Carb-Cholecalciferol (CALCIUM HIGH POTENCY/VITAMIN D) 600-5 MG-MCG TABS Take by mouth.     cholecalciferol (VITAMIN D) 1000 units tablet Take 1,000 Units by mouth daily.     COD LIVER OIL PO Take 1 tablet by mouth daily.     levothyroxine (SYNTHROID) 88 MCG tablet Take 88 mcg by mouth daily.     losartan (COZAAR) 100 MG tablet Take 100 mg by mouth daily.     No current facility-administered medications for this visit.    REVIEW OF SYSTEMS:   Pertinent information mentioned in HPI All other systems were reviewed with the  patient and are negative.  PHYSICAL EXAMINATION: ECOG PERFORMANCE STATUS: 0 - Asymptomatic  Vitals:   02/10/23 1112 02/10/23 1115  BP: (!) 151/92 (!) 146/88  Pulse: 66   Temp: (!) 97.4 F (36.3 C)   SpO2: 100%    Filed Weights   02/10/23 1112  Weight: 160 lb 4.8 oz (72.7 kg)    GENERAL:alert, no distress and comfortable SKIN: skin color, texture, turgor are normal, no rashes or significant lesions EYES: normal, conjunctiva are pink and non-injected, sclera clear OROPHARYNX:no exudate, no erythema and lips, buccal mucosa, and tongue normal  NECK: supple, thyroid normal size, non-tender, without nodularity LYMPH:  no palpable lymphadenopathy in the cervical, axillary or inguinal LUNGS: clear to auscultation and percussion with normal breathing effort HEART: regular rate & rhythm and no murmurs and no lower extremity edema ABDOMEN:abdomen soft, non-tender and normal bowel sounds Musculoskeletal:no cyanosis of digits and no clubbing  PSYCH: alert & oriented x 3 with fluent  speech NEURO: no focal motor/sensory deficits  LABORATORY DATA:  I have reviewed the data as listed No results found for: "WBC", "HGB", "HCT", "MCV", "PLT" No results for input(s): "NA", "K", "CL", "CO2", "GLUCOSE", "BUN", "CREATININE", "CALCIUM", "GFRNONAA", "GFRAA", "PROT", "ALBUMIN", "AST", "ALT", "ALKPHOS", "BILITOT", "BILIDIR", "IBILI" in the last 8760 hours.  RADIOGRAPHIC STUDIES: I have personally reviewed the radiological images as listed and agreed with the findings in the report. MM RT BREAST BX W LOC DEV 1ST LESION IMAGE BX SPEC STEREO GUIDE  Addendum Date: 02/07/2023   ADDENDUM REPORT: 02/07/2023 10:21 ADDENDUM: PATHOLOGY revealed: Site A. BREAST CALCIFICATIONS, RIGHT POSTERIOR UPPER INNER QUADRANT; STEREOTACTIC BIOPSY: - HIGH-GRADE DUCTAL CARCINOMA IN SITU (DCIS), COMEDO-TYPE WITH CALCIFICATIONS. Pathology results are CONCORDANT with imaging findings, per Dr. Norva Pavlov. PATHOLOGY revealed: Site B. BREAST CALCIFICATIONS, RIGHT MEDIAL RETROAREOLAR; STEREOTACTIC BIOPSY: - HIGH-GRADE DUCTAL CARCINOMA IN SITU (DCIS), COMEDO-TYPE WITH CALCIFICATIONS Pathology results are CONCORDANT with imaging findings, per Dr. Norva Pavlov. Pathology results and recommendations below were discussed with patient by telephone on 02/04/2023. Patient reported biopsy site within normal limits with slight tenderness at the site. Post biopsy care instructions were reviewed, questions were answered and my direct phone number was provided to patient. Patient was instructed to call [facility] if any concerns or questions arise related to the biopsy. RECOMMENDATIONS: 1. Surgical and oncological consultation. Request for surgical and oncological consultation relayed to Irving Shows RN at [facility] by Randa Lynn RN on 02/07/2023. 2. Consider pretreatment bilateral breast MRI with and without contrast to determine extent of breast disease given diagnosis of high grade DCIS. Pathology  results reported by Randa Lynn RN on 02/07/2023. Electronically Signed   By: Norva Pavlov M.D.   On: 02/07/2023 10:21   Result Date: 02/07/2023 CLINICAL DATA:  Patient presents for stereotactic biopsy of 2 sites of calcifications in the RIGHT breast. EXAM: RIGHT BREAST STEREOTACTIC CORE NEEDLE BIOPSY x2 COMPARISON:  Previous exam(s). FINDINGS: The patient and I discussed the procedure of stereotactic-guided biopsy including benefits and alternatives. We discussed the high likelihood of a successful procedure. We discussed the risks of the procedure including infection, bleeding, tissue injury, clip migration, and inadequate sampling. Informed written consent was given. The usual time out protocol was performed immediately prior to the procedure. Site 1: Lesion quadrant: Posterior UPPER INNER QUADRANT, coil clip Using sterile technique and lidocaine and lidocaine with epinephrine as local anesthetic, under stereotactic guidance, a 9 gauge vacuum assisted device was used to perform core needle biopsy of calcifications in the  posterior UPPER INNER QUADRANT of the RIGHT breast using a MEDIAL approach. Specimen radiograph was performed showing calcifications in numerous tissue samples. Specimens with calcifications are identified for pathology. At the conclusion of the procedure, coil tissue marker clip was deployed into the biopsy cavity. Site 2: Lesion quadrant: MEDIAL retroareolar, X clip Using sterile technique and lidocaine and lidocaine with epinephrine as local anesthetic, under stereotactic guidance, a 9 gauge vacuum assisted device was used to perform core needle biopsy of calcifications in the MEDIAL retroareolar region of the RIGHT breast using a MEDIAL approach. Specimen radiograph was performed showing calcifications in numerous tissue samples. Specimens with calcifications are identified for pathology. At the conclusion of the procedure, X tissue marker clip was deployed into the biopsy cavity. Follow-up  2-view mammogram was performed and dictated separately. IMPRESSION: Stereotactic-guided biopsy of 2 sites of calcifications in the MEDIAL RIGHT breast. No apparent complications. Electronically Signed: By: Norva Pavlov M.D. On: 02/03/2023 08:53   MM RT BREAST BX W LOC DEV EA AD LESION IMG BX SPEC STEREO GUIDE  Addendum Date: 02/07/2023   ADDENDUM REPORT: 02/07/2023 10:21 ADDENDUM: PATHOLOGY revealed: Site A. BREAST CALCIFICATIONS, RIGHT POSTERIOR UPPER INNER QUADRANT; STEREOTACTIC BIOPSY: - HIGH-GRADE DUCTAL CARCINOMA IN SITU (DCIS), COMEDO-TYPE WITH CALCIFICATIONS. Pathology results are CONCORDANT with imaging findings, per Dr. Norva Pavlov. PATHOLOGY revealed: Site B. BREAST CALCIFICATIONS, RIGHT MEDIAL RETROAREOLAR; STEREOTACTIC BIOPSY: - HIGH-GRADE DUCTAL CARCINOMA IN SITU (DCIS), COMEDO-TYPE WITH CALCIFICATIONS Pathology results are CONCORDANT with imaging findings, per Dr. Norva Pavlov. Pathology results and recommendations below were discussed with patient by telephone on 02/04/2023. Patient reported biopsy site within normal limits with slight tenderness at the site. Post biopsy care instructions were reviewed, questions were answered and my direct phone number was provided to patient. Patient was instructed to call [facility] if any concerns or questions arise related to the biopsy. RECOMMENDATIONS: 1. Surgical and oncological consultation. Request for surgical and oncological consultation relayed to Irving Shows RN at [facility] by Randa Lynn RN on 02/07/2023. 2. Consider pretreatment bilateral breast MRI with and without contrast to determine extent of breast disease given diagnosis of high grade DCIS. Pathology results reported by Randa Lynn RN on 02/07/2023. Electronically Signed   By: Norva Pavlov M.D.   On: 02/07/2023 10:21   Result Date: 02/07/2023 CLINICAL DATA:  Patient presents for stereotactic biopsy of 2 sites of calcifications in the RIGHT breast.  EXAM: RIGHT BREAST STEREOTACTIC CORE NEEDLE BIOPSY x2 COMPARISON:  Previous exam(s). FINDINGS: The patient and I discussed the procedure of stereotactic-guided biopsy including benefits and alternatives. We discussed the high likelihood of a successful procedure. We discussed the risks of the procedure including infection, bleeding, tissue injury, clip migration, and inadequate sampling. Informed written consent was given. The usual time out protocol was performed immediately prior to the procedure. Site 1: Lesion quadrant: Posterior UPPER INNER QUADRANT, coil clip Using sterile technique and lidocaine and lidocaine with epinephrine as local anesthetic, under stereotactic guidance, a 9 gauge vacuum assisted device was used to perform core needle biopsy of calcifications in the posterior UPPER INNER QUADRANT of the RIGHT breast using a MEDIAL approach. Specimen radiograph was performed showing calcifications in numerous tissue samples. Specimens with calcifications are identified for pathology. At the conclusion of the procedure, coil tissue marker clip was deployed into the biopsy cavity. Site 2: Lesion quadrant: MEDIAL retroareolar, X clip Using sterile technique and lidocaine and lidocaine with epinephrine as local anesthetic, under stereotactic guidance, a 9 gauge vacuum  assisted device was used to perform core needle biopsy of calcifications in the MEDIAL retroareolar region of the RIGHT breast using a MEDIAL approach. Specimen radiograph was performed showing calcifications in numerous tissue samples. Specimens with calcifications are identified for pathology. At the conclusion of the procedure, X tissue marker clip was deployed into the biopsy cavity. Follow-up 2-view mammogram was performed and dictated separately. IMPRESSION: Stereotactic-guided biopsy of 2 sites of calcifications in the MEDIAL RIGHT breast. No apparent complications. Electronically Signed: By: Norva Pavlov M.D. On: 02/03/2023 08:53    MM CLIP PLACEMENT RIGHT  Result Date: 02/03/2023 CLINICAL DATA:  Status post stereotactic guided core biopsy of calcifications in the RIGHT breast. EXAM: 3D DIAGNOSTIC RIGHT MAMMOGRAM POST STEREOTACTIC BIOPSY x2 COMPARISON:  Previous exam(s). FINDINGS: 3D Mammographic images were obtained following stereotactic guided biopsy of posterior extent of calcifications, within the posterior UPPER INNER QUADRANT of the RIGHT breast and placement of a coil shaped clip. The biopsy marking clip is in expected position at the site of biopsy. Following stereotactic biopsy of the more anterior extent of calcifications, within the MEDIAL retroareolar region of the RIGHT breast, an X shaped was placed and is identified in the expected location. The tissue marker clips are 4.9 centimeters apart on the true LATERAL projection and 4.3 centimeters apart on the craniocaudal projection. Extensive residual calcifications are present. IMPRESSION: Tissue marker clips are in the expected locations after biopsy. Final Assessment: Post Procedure Mammograms for Marker Placement Electronically Signed   By: Norva Pavlov M.D.   On: 02/03/2023 09:56

## 2023-02-21 ENCOUNTER — Ambulatory Visit: Payer: Medicare HMO | Admitting: Surgery

## 2023-02-21 ENCOUNTER — Encounter: Payer: Self-pay | Admitting: Surgery

## 2023-02-21 VITALS — BP 149/84 | HR 71 | Temp 98.0°F | Ht 66.0 in | Wt 157.0 lb

## 2023-02-21 DIAGNOSIS — D0511 Intraductal carcinoma in situ of right breast: Secondary | ICD-10-CM

## 2023-02-21 NOTE — Patient Instructions (Addendum)
We have spoken today about breast surgery, lumpectomy verses mastectomy.   We will refer you to [facility] in [place] for a second opinion with Dr Carolynne Edouard for breast surgery.  If after your appointment with Dr Carolynne Edouard you decide that you would like to have surgery with [place] please call to start the scheduling process. Our number is [phone]. Let us know if we need to send a referral to plastic surgery for a consultation.   Skin Sparing Mastectomy A mastectomy is a surgery to remove a breast. Skin sparing mastectomy (SSM) is a technique that can be used during a mastectomy. When this technique is used, all of the skin over the breast is left intact, while all the underlying breast tissue, the nipple, and sometimes the areola are removed. The technique allows the breast to be reconstructed during the surgery. Most women with breast cancer can have this type of surgery unless their cancer is close to or involves the skin over the breast. Tell a health care provider about: Any allergies you have. All medicines you are taking, including vitamins, herbs, supplements, eye drops, creams, and over-the-counter medicines. Any problems you or family members have had with anesthetic medicines. Any bleeding problems you have. Any surgeries you have had. Any medical conditions you have. Whether you are pregnant or may be pregnant. What are the risks? Generally, this is a safe procedure. However, problems may occur, including: Infection. Bleeding. Allergic reactions to medicines or dyes. The formation of a pocket of clear fluid (seroma). A skin infection caused by bacteria (cellulitis). Damage to nearby structures or organs. A collapsed lung (pneumothorax). This is rare. Other risks include: Death of tissue (necrosis). People who use tobacco products are at increased risk for this problem. Blood clots. Nerve damage due to poor positioning of the arm during surgery. What happens before the  procedure? Medicines Ask your health care provider about: Changing or stopping your regular medicines. These include any diabetes medicines or blood thinners you take. Taking medicines such as aspirin and ibuprofen. These medicines can thin your blood. Do not take these medicines unless your health care provider tells you to take them. Taking over-the-counter medicines, vitamins, herbs, and supplements. Surgery safety Ask your health care provider: How your surgery site will be marked. What steps will be taken to help prevent infection. These may include: Washing skin with a germ-killing soap. Taking antibiotic medicine. General instructions You will most often be checked for abnormal lymph nodes in the area around the affected breast during the surgery. Do not use any products that contain nicotine or tobacco for at least 4 weeks before the procedure. These products include cigarettes, chewing tobacco, and vaping devices, such as e-cigarettes. If you need help quitting, ask your health care provider. If you will be going home right after the procedure, plan to have a responsible adult: Take you home from the hospital or clinic. You will not be allowed to drive. Care for you for the time you are told. What happens during the procedure?  An IV will be inserted into one of your veins. You will be given one or more of the following: A medicine to help you relax (sedative). A medicine to numb the area (local anesthetic). A medicine to make you fall asleep (general anesthetic). An incision will be made. The underlying breast tissue, the nipple, and possibly the areola will be removed. Tissue from the area may be sent to be examined under a microscope to look for cancer cells (  biopsy). Lymph nodes in the area may be checked for cancer and removed if necessary. Skin around the breast will be preserved to form a pocket with flaps that will be developed to insert a breast implant. An implant or  tissue from another part of your body may be inserted into the pocket made by the flaps. A tube will be placed to drain blood and fluids that collect in the surgical area. The incision will be closed with sutures (stitches), skin glue, or skin adhesive strips. A gauze bandage (dressing) will be put over the incision. The procedure may vary among health care providers and hospitals. What happens after the procedure? Your blood pressure, heart rate, breathing rate, and blood oxygen level will be monitored until you leave the hospital or clinic. You will be given pain medicine as needed. You may stay at the hospital at least overnight so that your health care providers can make sure. You are able to tolerate pain-relieving medicines and other medicines. You are able to eat. You are hydrated. You will be told how to care for your drain and incisions at home. Do not drive until your health care provider says that it is safe. Summary Skin sparing mastectomy is a technique that allows the breast to be reconstructed during a surgery that is done to remove a breast. For the procedure, you will be given a medicine to make you fall asleep. You may need to stay in the hospital at least overnight after the procedure. This information is not intended to replace advice given to you by your health care provider. Make sure you discuss any questions you have with your health care provider. Document Revised: 09/28/2021 Document Reviewed: 09/28/2021 Elsevier Patient Education  2024 [employer].

## 2023-02-22 ENCOUNTER — Encounter: Payer: Self-pay | Admitting: Surgery

## 2023-02-22 NOTE — Progress Notes (Signed)
02/21/2023  Reason for Visit:  Right breast DCIS  Requesting Provider:  Laren Everts, NP  History of Present Illness: Krystal Bennett is a 67 y.o. 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  The patient had a screening mammogram on 12/23/22 which showed right breast calcifications.  This was confirmed on diagnostic mammogram on 01/03/23 which noted the calcifications spanned 6.5 cm length from mid breast at the superficial portion to medial breast at the deep portion.  Biopsies of both the anterior and posterior portions were obtained on 02/03/23 which showed DCIS on both samples.  The patient denies any breast pain other than initially with the biopsies, denies any skin changes or nipple changes.  Menarche was at age 53, has two sons, no history of prior breast biopsies.  She has history of papillary thyroid microcarcinoma.  This was her first mammogram since 2015.  She reports that in the interim she was switching PCPs a lot and was not able to get a mammogram.  Past Medical History: Past Medical History:  Diagnosis Date   HTN (hypertension)    Hyperlipidemia      Past Surgical History: Past Surgical History:  Procedure Laterality Date   BREAST BIOPSY Right 02/03/2023   stereo calcs #1 posterior group, coil marker, path pending   BREAST BIOPSY Right 02/03/2023   stereo bx calcs #2 anterior group, x marker, path pending   BREAST BIOPSY Right 02/03/2023   MM RT BREAST BX W LOC DEV EA AD LESION IMG BX SPEC STEREO GUIDE 02/03/2023 [facility]   BREAST BIOPSY Right 02/03/2023   MM RT BREAST BX W LOC DEV 1ST LESION IMAGE BX SPEC STEREO GUIDE 02/03/2023 [facility]   CESAREAN SECTION     THYROIDECTOMY N/A 10/04/2016   Procedure: THYROIDECTOMY;  Surgeon: Vernie Murders, MD;  Location: [facility];  Service: ENT;  Laterality: N/A;   WISDOM TOOTH EXTRACTION      Home Medications: Prior to Admission medications   Medication Sig Start Date End Date Taking? Authorizing  Provider  atorvastatin (LIPITOR) 20 MG tablet Take 0.5 tablets by mouth daily at 6 PM. 06/05/15  Yes [provider]  b complex vitamins capsule Take 1 capsule by mouth daily.   Yes [provider]  Calcium Carb-Cholecalciferol (CALCIUM HIGH POTENCY/VITAMIN D) 600-5 MG-MCG TABS Take by mouth.   Yes [provider]  Cholecalciferol (VITAMIN D-3) 125 MCG (5000 UT) TABS Take 125 mcg by mouth daily.   Yes [provider]  COLLAGEN PO Take 1 each by mouth daily. Vital Proteins Collagen Peptides   Yes [provider]  levothyroxine (SYNTHROID) 88 MCG tablet Take 88 mcg by mouth daily. 12/30/22  Yes [provider]  losartan (COZAAR) 100 MG tablet Take 100 mg by mouth daily.   Yes [provider]  Multiple Vitamins-Minerals (MULTIVITAMIN WOMENS 50+ ADV PO) Take 2 tablets by mouth daily.   Yes [provider]  NON FORMULARY Take 2 tablets by mouth daily. Propolis Complete Gut Health   Yes [provider]  NON FORMULARY Take by mouth daily. Propolis, Royal Jelly, Bee Pollin   Yes [provider]  Omega-3 Fatty Acids (FISH OIL) 1000 MG CAPS Take 2 capsules by mouth daily.   Yes [provider]    Allergies: No Known Allergies  Social History:  reports that she has never smoked. She has never been exposed to tobacco smoke. She has never used smokeless tobacco. She reports current alcohol use. She reports that she does not  use drugs.   Family History: No family history on file.  Review of Systems: Review of Systems  Constitutional:  Negative for chills and fever.  HENT:  Negative for hearing loss.   Respiratory:  Negative for shortness of breath.   Cardiovascular:  Negative for chest pain.  Gastrointestinal:  Negative for abdominal pain, nausea and vomiting.  Genitourinary:  Negative for dysuria.  Musculoskeletal:  Negative for myalgias.  Skin:  Negative for rash.  Neurological:  Negative for  dizziness.  Psychiatric/Behavioral:  Negative for depression.     Physical Exam BP (!) 149/84   Pulse 71   Temp 98 F (36.7 C)   Ht 5\' 6"  (1.676 m)   Wt 157 lb (71.2 kg)   SpO2 98%   BMI 25.34 kg/m  CONSTITUTIONAL: No acute distress, well nourished. HEENT:  Normocephalic, atraumatic, extraocular motion intact. NECK: Trachea is midline, and there is no jugular venous distension.  RESPIRATORY:  Lungs are clear, and breath sounds are equal bilaterally. Normal respiratory effort without pathologic use of accessory muscles. CARDIOVASCULAR: Heart is regular without murmurs, gallops, or rubs. BREAST:  Right breast with two biopsy sites which are healing well.  No palpable masses, other skin changes, or nipple changes.  No right axillary lymphadenopathy.  Left breast without any palpable masses or skin changes.  Her left nipple is inverted which is chronic for her.  No left axillary lymphadenopathy. MUSCULOSKELETAL:  Normal muscle strength and tone in all four extremities.  No peripheral edema or cyanosis. SKIN: Skin turgor is normal. There are no pathologic skin lesions.  NEUROLOGIC:  Motor and sensation is grossly normal.  Cranial nerves are grossly intact. PSYCH:  Alert and oriented to person, place and time. Affect is normal.  Laboratory Analysis: Right breast biopsies on 02/03/23: DIAGNOSIS:  A. BREAST CALCIFICATIONS, RIGHT POSTERIOR UPPER INNER QUADRANT; STEREOTACTIC BIOPSY:  - HIGH-GRADE DUCTAL CARCINOMA IN SITU (DCIS), COMEDO-TYPE WITH CALCIFICATIONS.   B. BREAST CALCIFICATIONS, RIGHT MEDIAL RETROAREOLAR; STEREOTACTIC BIOPSY:  - HIGH-GRADE DUCTAL CARCINOMA IN SITU (DCIS), COMEDO-TYPE WITH CALCIFICATIONS.   Imaging: Mammogram on 01/03/23: FINDINGS: Spot 2D magnification views and full true lateral tomosynthesis views of the right breast were performed demonstrating persistence of fine pleomorphic calcifications in a segmental distribution in the medial right breast spanning  approximately 6.5 cm. No associated mass or distortion.   IMPRESSION: Suspicious calcifications in the medial right breast spanning 6.5 cm.   RECOMMENDATION: Stereotactic core needle biopsy x2 of the right breast.   I have discussed the findings and recommendations with the patient. If applicable, a reminder letter will be sent to the patient regarding the next appointment.   BI-RADS CATEGORY  5: Highly suggestive of malignancy.  Assessment and Plan: This is a 67 y.o. female with right breast DCIS.  --Discussed with the patient the findings on her mammograms and her biopsies.  Overall she has a large area of calcifications in the right breast which spans about 6.5 cm in size.  Both anterior and posterior biopsies showed DCIS.  Discussed with her my concern for the size involved relative to her breast size.  Discussed with her the typical options for management including lumpectomy which would require radiation, and if upgraded diagnosis, sentinel lymph node biopsy, vs simple mastectomy with sentinel lymph node biopsy, which would not require radiation typically, and would also give her the option for immediate reconstruction.  She is unsure of which option to take.  Discussed with her that a lumpectomy for this given the size, would most  likely leave a large cosmetic defect in the right breast.  However, I also offered her a 2nd opinion with a more experienced breast surgeon.  She's in agreement with this. --Will send referral to [facility] for 2nd opinion.  She will keep [place] posted about her decision after seeing them.  We can certainly continue seeing her, but I still think a mastectomy would be best overall. --Patient understands this plan and all of her questions have been answered.  I spent 40 minutes dedicated to the care of this patient on the date of this encounter to include pre-visit review of records, face-to-face time with the patient discussing diagnosis and management,  and any post-visit coordination of care.   Howie Ill, MD  [facility]

## 2023-02-24 ENCOUNTER — Other Ambulatory Visit: Payer: Self-pay

## 2023-02-24 DIAGNOSIS — D0511 Intraductal carcinoma in situ of right breast: Secondary | ICD-10-CM

## 2023-03-01 ENCOUNTER — Ambulatory Visit: Payer: Self-pay | Admitting: General Surgery

## 2023-03-01 DIAGNOSIS — D0511 Intraductal carcinoma in situ of right breast: Secondary | ICD-10-CM

## 2023-03-01 MED ORDER — KETOROLAC TROMETHAMINE 15 MG/ML IJ SOLN
15.0000 mg | Freq: Once | INTRAMUSCULAR | Status: AC
Start: 2023-03-01 — End: 2023-03-06

## 2023-03-07 NOTE — Therapy (Signed)
OUTPATIENT PHYSICAL THERAPY BREAST CANCER BASELINE EVALUATION   Patient Name: Krystal Bennett MRN: [id number] DOB:1956-03-11, 67 y.o., female Today's Date: 03/08/2023  END OF SESSION:  PT End of Session - 03/08/23 0803     Visit Number 1    Number of Visits 2    Date for PT Re-Evaluation 05/03/23    PT Start Time 0804    PT Stop Time 0845    PT Time Calculation (min) 41 min    Activity Tolerance Patient tolerated treatment well    Behavior During Therapy [facility] for tasks assessed/performed             Past Medical History:  Diagnosis Date   HTN (hypertension)    Hyperlipidemia    Past Surgical History:  Procedure Laterality Date   BREAST BIOPSY Right 02/03/2023   stereo calcs #1 posterior group, coil marker, path pending   BREAST BIOPSY Right 02/03/2023   stereo bx calcs #2 anterior group, x marker, path pending   BREAST BIOPSY Right 02/03/2023   MM RT BREAST BX W LOC DEV EA AD LESION IMG BX SPEC STEREO GUIDE 02/03/2023 [facility]   BREAST BIOPSY Right 02/03/2023   MM RT BREAST BX W LOC DEV 1ST LESION IMAGE BX SPEC STEREO GUIDE 02/03/2023 [facility]   CESAREAN SECTION     THYROIDECTOMY N/A 10/04/2016   Procedure: THYROIDECTOMY;  Surgeon: Vernie Murders, MD;  Location: [facility];  Service: ENT;  Laterality: N/A;   WISDOM TOOTH EXTRACTION     Patient Active Problem List   Diagnosis Date Noted   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 Chevis Pretty III  REFERRING DIAG: Right Breast Cancer  THERAPY DIAG:  Intraductal carcinoma in situ of right breast  Abnormal posture  Rationale for Evaluation and Treatment: Rehabilitation  ONSET DATE: 02/04/2023  SUBJECTIVE:                                                                                                                                                                                            SUBJECTIVE STATEMENT: Patient reports she is here today to be seen by her medical team for her newly diagnosed right breast cancer.   PERTINENT HISTORY:  Patient was diagnosed on 02/04/2023 with right high grade DCIS. It measures 6.5 cm and is located in the upper inner quadrant and medial retroareolar area. ER/PR and Her 2 testing not yet done. Pt is planning on a Right Mastectomy with SLNB but does  not have a date yet.  PATIENT GOALS:   reduce lymphedema risk and learn post op HEP.   PAIN:  Are you having pain? No  PRECAUTIONS: Active CA , hypertension, hypothyroidism, pre-diabetes  HAND DOMINANCE: right  WEIGHT BEARING RESTRICTIONS: No  FALLS:  Has patient fallen in last 6 months? No  LIVING ENVIRONMENT: Patient lives with: alone with dog Lives in: House/apartment Has following equipment at home: NA  OCCUPATION: office work  LEISURE: walking, sewing, graphics  PRIOR LEVEL OF FUNCTION: Independent   OBJECTIVE:  COGNITION: Overall cognitive status: Within functional limits for tasks assessed    POSTURE:  Forward head and rounded shoulders posture  UPPER EXTREMITY AROM/PROM:  A/PROM RIGHT   eval   Shoulder extension 60  Shoulder flexion 156  Shoulder abduction 180  Shoulder internal rotation 70  Shoulder external rotation 96    (Blank rows = not tested)  A/PROM LEFT   eval  Shoulder extension 62  Shoulder flexion 162  Shoulder abduction 180  Shoulder internal rotation 70  Shoulder external rotation 98    (Blank rows = not tested)  CERVICAL AROM: All within functional limits:    UPPER EXTREMITY STRENGTH: WNL  LYMPHEDEMA ASSESSMENTS:   LANDMARK RIGHT   eval  10 cm proximal to olecranon process 26.3  Olecranon process 25.0  10 cm proximal to ulnar styloid process 21.1  Just proximal to ulnar styloid process 15.5  Across hand at thumb web space 19.7  At base of 2nd digit 5.8   (Blank rows = not tested)  LANDMARK LEFT   eval  10 cm proximal to olecranon process 27.0  Olecranon process 25.2  10 cm proximal to ulnar styloid process 20  Just proximal to ulnar styloid process 15.0  Across hand at thumb web space 18.4  At base of 2nd digit 5.7  (Blank rows = not tested)  L-DEX LYMPHEDEMA SCREENING:  The patient was assessed using the L-Dex machine today to produce a lymphedema index baseline score. The patient will be reassessed on a regular basis (typically every 3 months) to obtain new L-Dex scores. If the score is > 6.5 points away from his/her baseline score indicating onset of subclinical lymphedema, it will be recommended to wear a compression garment for 4 weeks, 12 hours per day and then be reassessed. If the score continues to be > 6.5 points from baseline at reassessment, we will initiate lymphedema treatment. Assessing in this manner has a 95% rate of preventing clinically significant lymphedema.   L-DEX FLOWSHEETS - 03/08/23 0800       L-DEX LYMPHEDEMA SCREENING   Measurement Type Unilateral    L-DEX MEASUREMENT EXTREMITY Upper Extremity    POSITION  Standing    DOMINANT SIDE Right    At Risk Side Right    BASELINE SCORE (UNILATERAL) -4             QUICK DASH SURVEY: 2.27  PATIENT EDUCATION:  Education details: Lymphedema risk reduction and post op shoulder/posture HEP Person educated: Patient Education method: Explanation, Demonstration, Handout Education comprehension: Patient verbalized understanding and returned demonstration  HOME EXERCISE PROGRAM: Patient was instructed today in a home exercise program today for post op shoulder range of motion. These included active assist shoulder flexion in sitting, scapular retraction, wall walking with shoulder abduction, and hands behind head external rotation.  She was encouraged to do these twice a day, holding 3 seconds and repeating 5 times when permitted by her  physician.   ASSESSMENT:  CLINICAL IMPRESSION:  Pts. multidisciplinary medical team met prior to her assessments to determine a recommended treatment plan. She is planning to have A Right Mastectomy with SLNB but she does not have a date yet.Marland Kitchen She will benefit from a post op PT reassessment to determine needs and from L-Dex screens every 3 months for 2 years to detect subclinical lymphedema.  Pt will benefit from skilled therapeutic intervention to improve on the following deficits: Decreased knowledge of precautions, impaired UE functional use, pain, decreased ROM, postural dysfunction.   PT treatment/interventions: ADL/self-care home management, pt/family education, therapeutic exercise  REHAB POTENTIAL: Excellent  CLINICAL DECISION MAKING: Stable/uncomplicated  EVALUATION COMPLEXITY: Low   GOALS: Goals reviewed with patient? YES  LONG TERM GOALS: (STG=LTG)    Name Target Date Goal status  1 Pt will be able to verbalize understanding of pertinent lymphedema risk reduction practices relevant to her dx specifically related to skin care.  Baseline:  No knowledge 03/08/2023 Achieved at eval  2 Pt will be able to return demo and/or verbalize understanding of the post op HEP related to regaining shoulder ROM. Baseline:  No knowledge 03/08/2023 Achieved at eval  3 Pt will be able to verbalize understanding of the importance of attending the post op After Breast CA Class for further lymphedema risk reduction education and therapeutic exercise.  Baseline:  No knowledge 03/08/2023 Achieved at eval  4 Pt will demo she has regained full shoulder ROM and function post operatively compared to baselines.  Baseline: See objective measurements taken today. 05/03/2023 INITIAL    PLAN:  PT FREQUENCY/DURATION: EVAL and 1 follow up appointment.   PLAN FOR NEXT SESSION: will reassess 3-4 weeks post op to determine needs.Set up SOZO, ABC   Patient will follow up at outpatient cancer rehab 3-4 weeks  following surgery.  If the patient requires physical therapy at that time, a specific plan will be dictated and sent to the referring physician for approval. The patient was educated today on appropriate basic range of motion exercises to begin post operatively and the importance of attending the After Breast Cancer class following surgery.  Patient was educated today on lymphedema risk reduction practices as it pertains to recommendations that will benefit the patient immediately following surgery.  She verbalized good understanding.    Physical Therapy Information for After Breast Cancer Surgery/Treatment:  Lymphedema is a swelling condition that you may be at risk for in your arm if you have lymph nodes removed from the armpit area.  After a sentinel node biopsy, the risk is approximately 5-9% and is higher after an axillary node dissection.  There is treatment available for this condition and it is not life-threatening.  Contact your physician or physical therapist with concerns. You may begin the 4 shoulder/posture exercises (see additional sheet) when permitted by your physician (typically a week after surgery).  If you have drains, you may need to wait until those are removed before beginning range of motion exercises.  A general recommendation is to not lift your arms above shoulder height until drains are removed.  These exercises should be done to your tolerance and gently.  This is not a "no pain/no gain" type of recovery so listen to your body and stretch into the range of motion that you can tolerate, stopping if you have pain.  If you are having immediate reconstruction, ask your plastic surgeon about doing exercises as he or she may want you to wait. We encourage you to attend the free one time ABC (After Breast Cancer)  class offered by [facility].  You will learn information related to lymphedema risk, prevention and treatment and additional exercises to regain  mobility following surgery.  You can call [phone] for more information.  This is offered the 1st and 3rd Monday of each month.  You only attend the class one time. While undergoing any medical procedure or treatment, try to avoid blood pressure being taken or needle sticks from occurring on the arm on the side of cancer.   This recommendation begins after surgery and continues for the rest of your life.  This may help reduce your risk of getting lymphedema (swelling in your arm). An excellent resource for those seeking information on lymphedema is the National Lymphedema Network's web site. It can be accessed at www.lymphnet.org If you notice swelling in your hand, arm or breast at any time following surgery (even if it is many years from now), please contact your doctor or physical therapist to discuss this.  Lymphedema can be treated at any time but it is easier for you if it is treated early on.  If you feel like your shoulder motion is not returning to normal in a reasonable amount of time, please contact your surgeon or physical therapist.  [facility] [phone]. [address], [address], [place] [address]  ABC CLASS After Breast Cancer Class  After Breast Cancer Class is a specially designed exercise class to assist you in a safe recover after having breast cancer surgery.  In this class you will learn how to get back to full function whether your drains were just removed or if you had surgery a month ago.  This one-time class is held the 1st and 3rd Monday of every month from 11:00 a.m. until 12:00 noon virtually.  This class is FREE and space is limited. For more information or to register for the next available class, call [phone].  Class Goals  Understand specific stretches to improve the flexibility of you chest and shoulder. Learn ways to safely strengthen your upper body and improve your posture. Understand the warning signs of  infection and why you may be at risk for an arm infection. Learn about Lymphedema and prevention.  ** You do not attend this class until after surgery.  Drains must be removed to participate  Patient was instructed today in a home exercise program today for post op shoulder range of motion. These included active assist shoulder flexion in sitting, scapular retraction, wall walking with shoulder abduction, and hands behind head external rotation.  She was encouraged to do these twice a day, holding 3 seconds and repeating 5 times when permitted by her physician.    Waynette Buttery, PT 03/08/2023, 8:46 AM

## 2023-03-08 ENCOUNTER — Ambulatory Visit: Payer: Medicare HMO | Admitting: Plastic Surgery

## 2023-03-08 ENCOUNTER — Encounter: Payer: Self-pay | Admitting: Plastic Surgery

## 2023-03-08 ENCOUNTER — Ambulatory Visit: Payer: Medicare HMO | Attending: General Surgery

## 2023-03-08 ENCOUNTER — Other Ambulatory Visit: Payer: Self-pay

## 2023-03-08 VITALS — BP 157/84 | HR 68 | Ht 66.0 in | Wt 158.2 lb

## 2023-03-08 DIAGNOSIS — D0511 Intraductal carcinoma in situ of right breast: Secondary | ICD-10-CM | POA: Insufficient documentation

## 2023-03-08 DIAGNOSIS — E890 Postprocedural hypothyroidism: Secondary | ICD-10-CM

## 2023-03-08 DIAGNOSIS — R293 Abnormal posture: Secondary | ICD-10-CM | POA: Insufficient documentation

## 2023-03-08 NOTE — Progress Notes (Addendum)
Patient ID: Krystal Bennett, female    DOB: 06/24/1956, 67 y.o.   MRN: [id number]   Chief Complaint  Patient presents with   Advice Only    The patient is a lovely 67 year old female here for consultation for breast reconstruction.  She is a patient of Dr. Billey Chang.  She is 5 feet 6 inches tall and weighs 158 pounds.  Her preoperative bra size is a B cup.  She was diagnosed with calcifications of the right breast in April as seen by her mammogram.  Biopsy showed right breast DCIS.  The calcifications span a large area of around 6.5 cm in the mid breast area.  Probably too close to salvage the nipple areolar complex.  She does not have breast pain little tenderness after the biopsy.  She has a past medical history of hypertension and hyperlipidemia but she is otherwise in good health.  She has had a thyroidectomy and a C-section in the past without complications.      Review of Systems  Constitutional: Negative.   HENT: Negative.    Eyes: Negative.   Respiratory: Negative.    Cardiovascular: Negative.   Gastrointestinal: Negative.   Endocrine: Negative.   Genitourinary: Negative.   Musculoskeletal: Negative.   Neurological: Negative.     Past Medical History:  Diagnosis Date   HTN (hypertension)    Hyperlipidemia     Past Surgical History:  Procedure Laterality Date   BREAST BIOPSY Right 02/03/2023   stereo calcs #1 posterior group, coil marker, path pending   BREAST BIOPSY Right 02/03/2023   stereo bx calcs #2 anterior group, x marker, path pending   BREAST BIOPSY Right 02/03/2023   MM RT BREAST BX W LOC DEV EA AD LESION IMG BX SPEC STEREO GUIDE 02/03/2023 [facility]   BREAST BIOPSY Right 02/03/2023   MM RT BREAST BX W LOC DEV 1ST LESION IMAGE BX SPEC STEREO GUIDE 02/03/2023 [facility]   CESAREAN SECTION     THYROIDECTOMY N/A 10/04/2016   Procedure: THYROIDECTOMY;  Surgeon: Vernie Murders, MD;  Location: [facility];  Service: ENT;  Laterality: N/A;   WISDOM TOOTH  EXTRACTION        Current Outpatient Medications:    atorvastatin (LIPITOR) 20 MG tablet, Take 0.5 tablets by mouth daily at 6 PM., Disp: , Rfl:    b complex vitamins capsule, Take 1 capsule by mouth daily., Disp: , Rfl:    Calcium Carb-Cholecalciferol (CALCIUM HIGH POTENCY/VITAMIN D) 600-5 MG-MCG TABS, Take by mouth., Disp: , Rfl:    Cholecalciferol (VITAMIN D-3) 125 MCG (5000 UT) TABS, Take 125 mcg by mouth daily., Disp: , Rfl:    COLLAGEN PO, Take 1 each by mouth daily. Vital Proteins Collagen Peptides, Disp: , Rfl:    levothyroxine (SYNTHROID) 88 MCG tablet, Take 88 mcg by mouth daily., Disp: , Rfl:    losartan (COZAAR) 100 MG tablet, Take 100 mg by mouth daily., Disp: , Rfl:    Multiple Vitamins-Minerals (MULTIVITAMIN WOMENS 50+ ADV PO), Take 2 tablets by mouth daily., Disp: , Rfl:    NON FORMULARY, Take 2 tablets by mouth daily. Propolis Complete Gut Health, Disp: , Rfl:    NON FORMULARY, Take by mouth daily. Propolis, Royal Jelly, Bee Pollin, Disp: , Rfl:    Omega-3 Fatty Acids (FISH OIL) 1000 MG CAPS, Take 2 capsules by mouth daily., Disp: , Rfl:    Objective:   Vitals:   03/08/23 1047  BP: (!) 157/84  Pulse: 68  SpO2: 98%  Physical Exam Vitals and nursing note reviewed.  Constitutional:      Appearance: Normal appearance.  HENT:     Head: Normocephalic and atraumatic.  Cardiovascular:     Rate and Rhythm: Normal rate.     Pulses: Normal pulses.  Pulmonary:     Effort: Pulmonary effort is normal.  Abdominal:     General: There is no distension.     Palpations: Abdomen is soft.     Tenderness: There is no abdominal tenderness.  Musculoskeletal:        General: Tenderness present. No swelling or deformity.  Skin:    General: Skin is warm.     Capillary Refill: Capillary refill takes less than 2 seconds.     Coloration: Skin is not jaundiced.     Findings: No erythema.  Neurological:     Mental Status: She is alert and oriented to person, place, and time.   Psychiatric:        Mood and Affect: Mood normal.        Behavior: Behavior normal.        Thought Content: Thought content normal.        Judgment: Judgment normal.     Assessment & Plan:  S/P complete thyroidectomy  Ductal carcinoma in situ (DCIS) of right breast  The options for reconstruction we explained to the patient / family for breast reconstruction.  There are two general categories of reconstruction.  We can reconstruction a breast with implants or use the patient's own tissue.  These were further discussed as listed.  Breast reconstruction is an optional procedure and eligibility depends on the full spectrum of the health of the patient and any co-morbidities.  More than one surgery is often needed to complete the reconstruction process.  The process can take three to twelve months to complete.  The breasts will not be identical due to many factors such as rib differences, shoulder asymmetry and treatments such as radiation.  The goal is to get the breasts to look normal and symmetrical in clothes.  Scars are a part of surgery and may fade some in time but will always be present under clothes.  Surgery may be an option on the non-cancer breast to achieve more symmetry.  No matter which procedure is chosen there is always the risk of complications and even failure of the body to heal.  This could result in no breast.    The options for reconstruction include:  1. Placement of a tissue expander with Acellular dermal matrix. When the expander is the desired size surgery is performed to remove the expander and place an implant.  In some cases the implant can be placed without an expander.  2. Autologous reconstruction can include using a muscle or tissue from another area of the body to create a breast.  3. Combined procedures (ie. latissismus dorsi flap) can be done with an expander / implant placed under the muscle.   The risks, benefits, scars and recovery time were discussed for  each of the above. Risks include bleeding, infection, hematoma, seroma, scarring, pain, wound healing complications, flap loss, fat necrosis, capsular contracture, need for implant removal, donor site complications, bulge, hernia, umbilical necrosis, need for urgent reoperation, and need for dressing changes.   The procedure the patient selected / that was best for the patient, was then discussed in further detail.  Total time: 45 minutes. This includes time spent with the patient during the visit as well as time spent before  and after the visit reviewing the chart, documenting the encounter, making phone calls and reviewing studies.   The patient has decided on a right sided mastectomy and she would like implant-based reconstruction.  I think this is a good option for her.  I reached out to Dr. Carolynne Edouard to discuss.  I will place the orders as well.  She can have a breast lift on the left side at the time of the exchange from the expander to the implant.  Pictures were obtained of the patient and placed in the chart with the patient's or guardian's permission.   [name] , DO

## 2023-03-09 ENCOUNTER — Telehealth: Payer: Self-pay

## 2023-03-09 NOTE — Telephone Encounter (Signed)
Faxed Rx, Demo, [employer], and OV note to [facility] [place] with confirmed receipt.

## 2023-03-16 ENCOUNTER — Telehealth: Payer: Self-pay | Admitting: *Deleted

## 2023-03-16 NOTE — Telephone Encounter (Signed)
Auth submitted for [id number], [id number] - approved [id number]  No auth req for [id number]

## 2023-03-18 ENCOUNTER — Encounter: Payer: Self-pay | Admitting: *Deleted

## 2023-03-18 NOTE — Progress Notes (Signed)
Krystal Bennett surgery is scheduled for 8/12.   She will see Dr. [name] back on 8/26.  VM left with appt. Details and AVS mailed.

## 2023-03-21 ENCOUNTER — Telehealth: Payer: Self-pay | Admitting: Plastic Surgery

## 2023-03-21 NOTE — Telephone Encounter (Signed)
Called and lvmail for patient to call the office to set up pre-op apt with [place].

## 2023-03-22 ENCOUNTER — Telehealth: Payer: Self-pay | Admitting: Plastic Surgery

## 2023-03-22 NOTE — Telephone Encounter (Signed)
Called again and left vmail to try and sch preop, lvmail for the pt

## 2023-03-29 ENCOUNTER — Ambulatory Visit (INDEPENDENT_AMBULATORY_CARE_PROVIDER_SITE_OTHER): Payer: Medicare HMO | Admitting: Physician Assistant

## 2023-03-29 ENCOUNTER — Encounter: Payer: Self-pay | Admitting: Physician Assistant

## 2023-03-29 VITALS — BP 182/98 | HR 66

## 2023-03-29 DIAGNOSIS — D0511 Intraductal carcinoma in situ of right breast: Secondary | ICD-10-CM

## 2023-03-29 MED ORDER — ONDANSETRON 4 MG PO TBDP: 4.0000 mg | ORAL_TABLET | Freq: Three times a day (TID) | ORAL | 0 refills | Status: AC | PRN

## 2023-03-29 MED ORDER — DIAZEPAM 2 MG PO TABS: 2.0000 mg | ORAL_TABLET | Freq: Two times a day (BID) | ORAL | 0 refills | Status: DC | PRN

## 2023-03-29 MED ORDER — CEPHALEXIN 500 MG PO CAPS: 500.0000 mg | ORAL_CAPSULE | Freq: Four times a day (QID) | ORAL | 0 refills | Status: AC

## 2023-03-29 MED ORDER — OXYCODONE HCL 5 MG PO TABS: 5.0000 mg | ORAL_TABLET | Freq: Four times a day (QID) | ORAL | 0 refills | Status: AC | PRN

## 2023-03-29 NOTE — Progress Notes (Signed)
Patient ID: Krystal Bennett, female    DOB: 08/18/1956, 67 y.o.   MRN: [id number]  Chief Complaint  Patient presents with   Pre-op Exam      ICD-10-CM   1. Ductal carcinoma in situ (DCIS) of right breast  D05.11        History of Present Illness: Krystal Bennett is a 67 y.o.  female  with a history of right breast DCIS.  She presents for preoperative evaluation for upcoming procedure, right breast mastectomy with sentinel lymph node biopsy and immediate breast reconstruction using tissue expander and Flex HD, scheduled for 04/18/2023 with Dr. Ulice Bold.  The patient has not had problems with anesthesia.  Previous thyroidectomy without complication.  She denies any personal or family history of blood clots or clotting disorder.  She denies any varicosities.  No nicotine use.  Denies any significant cardiac disease aside from her high blood pressure and high cholesterol.  She states that she has whitecoat syndrome which contributes to elevated blood pressures in clinic.  Today it is quite elevated at 182/98, but she denies any cardiac symptoms.  She did take her losartan earlier this morning, but is admittedly anxious about her upcoming surgery.  Emphasized the importance of checking her blood pressure regularly at home and following up with her PCP if it is not improved by the end of the day.  She will hold her vitamins and supplements prior to surgery.  Discussed the risks of breast reconstruction and patient expressed understanding and is agreeable.  She states that she wants to remain the same cup size.  Summary of Previous Visit: She was seen for consult by Dr. Ulice Bold on 03/08/2023.  At that time, she had recently been diagnosed with right breast DCIS.  She is a B cup preoperatively.  Likely will not be able to salvage NAC with right-sided mastectomy given size of calcifications.  Previous surgeries without complication or difficulty from anesthesia.  Discussed options for reconstruction  and patient like to proceed with implant-based reconstruction.  She understands that it is a 2 stage surgery.  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  She has already spoken with her employer and will let us know if any paperwork is required.  PMH Significant for: Right breast DCIS, HTN, HLD, thyroid cancer s/p thyroidectomy.   Past Medical History: Allergies: No Known Allergies  Current Medications:  Current Outpatient Medications:    atorvastatin (LIPITOR) 20 MG tablet, Take 0.5 tablets by mouth daily at 6 PM., Disp: , Rfl:    atorvastatin (LIPITOR) 40 MG tablet, Take 40 mg by mouth daily., Disp: , Rfl:    b complex vitamins capsule, Take 1 capsule by mouth daily., Disp: , Rfl:    Calcium Carb-Cholecalciferol (CALCIUM HIGH POTENCY/VITAMIN D) 600-5 MG-MCG TABS, Take by mouth., Disp: , Rfl:    cephALEXin (KEFLEX) 500 MG capsule, Take 1 capsule (500 mg total) by mouth 4 (four) times daily for 3 days., Disp: 12 capsule, Rfl: 0   Cholecalciferol (VITAMIN D-3) 125 MCG (5000 UT) TABS, Take 125 mcg by mouth daily., Disp: , Rfl:    COLLAGEN PO, Take 1 each by mouth daily. Vital Proteins Collagen Peptides, Disp: , Rfl:    diazepam (VALIUM) 2 MG tablet, Take 1 tablet (2 mg total) by mouth every 12 (twelve) hours as needed for muscle spasms., Disp: 20 tablet, Rfl: 0   levothyroxine (SYNTHROID) 88 MCG tablet, Take 88 mcg by mouth daily., Disp: , Rfl:  losartan (COZAAR) 100 MG tablet, Take 100 mg by mouth daily., Disp: , Rfl:    Multiple Vitamins-Minerals (MULTIVITAMIN WOMENS 50+ ADV PO), Take 2 tablets by mouth daily., Disp: , Rfl:    NON FORMULARY, Take 2 tablets by mouth daily. Propolis Complete Gut Health, Disp: , Rfl:    NON FORMULARY, Take by mouth daily. Propolis, Royal Jelly, Bee Pollin, Disp: , Rfl:    Omega-3 Fatty Acids (FISH OIL) 1000 MG CAPS, Take 2 capsules by mouth daily., Disp: , Rfl:    ondansetron (ZOFRAN-ODT) 4 MG disintegrating tablet, Take 1 tablet (4 mg total) by  mouth every 8 (eight) hours as needed for nausea or vomiting., Disp: 20 tablet, Rfl: 0   oxyCODONE (ROXICODONE) 5 MG immediate release tablet, Take 1 tablet (5 mg total) by mouth every 6 (six) hours as needed for up to 5 days for severe pain., Disp: 20 tablet, Rfl: 0  Past Medical Problems: Past Medical History:  Diagnosis Date   HTN (hypertension)    Hyperlipidemia     Past Surgical History: Past Surgical History:  Procedure Laterality Date   BREAST BIOPSY Right 02/03/2023   stereo calcs #1 posterior group, coil marker, path pending   BREAST BIOPSY Right 02/03/2023   stereo bx calcs #2 anterior group, x marker, path pending   BREAST BIOPSY Right 02/03/2023   MM RT BREAST BX W LOC DEV EA AD LESION IMG BX SPEC STEREO GUIDE 02/03/2023 [facility]   BREAST BIOPSY Right 02/03/2023   MM RT BREAST BX W LOC DEV 1ST LESION IMAGE BX SPEC STEREO GUIDE 02/03/2023 [facility]   CESAREAN SECTION     THYROIDECTOMY N/A 10/04/2016   Procedure: THYROIDECTOMY;  Surgeon: Vernie Murders, MD;  Location: [facility];  Service: ENT;  Laterality: N/A;   WISDOM TOOTH EXTRACTION      Social History: Social History   Socioeconomic History   Marital status: Single    Spouse name: Not on file   Number of children: Not on file   Years of education: Not on file   Highest education level: Not on file  Occupational History   Not on file  Tobacco Use   Smoking status: Never    Passive exposure: Never   Smokeless tobacco: Never  Vaping Use   Vaping status: Never Used  Substance and Sexual Activity   Alcohol use: Yes    Comment: OCC WINE   Drug use: No   Sexual activity: Not on file  Other Topics Concern   Not on file  Social History Narrative   Not on file   Social Determinants of Health   Financial Resource Strain: Low Risk  (08/05/2017)   Received from [facility], [facility]   Overall Financial Resource Strain (CARDIA)    Difficulty of Paying Living  Expenses: Not hard at all  Food Insecurity: No Food Insecurity (02/10/2023)   Hunger Vital Sign    Worried About Running Out of Food in the Last Year: Never true    Ran Out of Food in the Last Year: Never true  Transportation Needs: No Transportation Needs (02/10/2023)   PRAPARE - Administrator, Civil Service (Medical): No    Lack of Transportation (Non-Medical): No  Physical Activity: Insufficiently Active (08/05/2017)   Received from [facility], [facility]   Exercise Vital Sign    Days of Exercise per Week: 3 days    Minutes of Exercise per Session: 40 min  Stress:  No Stress Concern Present (08/05/2017)   Received from [facility], [facility]   [employer] of Occupational Health - Occupational Stress Questionnaire    Feeling of Stress : Not at all  Social Connections: Moderately Integrated (08/05/2017)   Received from [facility], [facility]   Social Connection and Isolation Panel [NHANES]    Frequency of Communication with Friends and Family: More than three times a week    Frequency of Social Gatherings with Friends and Family: More than three times a week    Attends Religious Services: More than 4 times per year    Active Member of [employer] or Organizations: Yes    Attends Banker Meetings: 1 to 4 times per year    Marital Status: Divorced  Intimate Partner Violence: Not At Risk (02/10/2023)   Humiliation, Afraid, Rape, and Kick questionnaire    Fear of Current or Ex-Partner: No    Emotionally Abused: No    Physically Abused: No    Sexually Abused: No    Family History: No family history on file.  Review of Systems: ROS Endorses some discomfort at the biopsy site, but otherwise no chest pain, Diffley breathing, leg swelling, or fevers.  Physical Exam: Vital Signs BP (!) 182/98 (BP Location: Left Arm, Patient Position: Sitting, Cuff  Size: Normal)   Pulse 66   SpO2 97%   Physical Exam Constitutional:      General: Not in acute distress.    Appearance: Normal appearance. Not ill-appearing.  HENT:     Head: Normocephalic and atraumatic.  Eyes:     Pupils: Pupils are equal, round. Cardiovascular:     Rate and Rhythm: Normal rate.    Pulses: Normal pulses.  Pulmonary:     Effort: No respiratory distress or increased work of breathing.  Speaks in full sentences. Abdominal:     General: Abdomen is flat. No distension.   Musculoskeletal: Normal range of motion. No lower extremity swelling or edema. No varicosities. Skin:    General: Skin is warm and dry.     Findings: No erythema or rash.  Neurological:     Mental Status: Alert and oriented to person, place, and time.  Psychiatric:        Mood and Affect: Mood normal.        Behavior: Behavior normal.    Assessment/Plan: The patient is scheduled for right breast mastectomy with sentinel lymph node biopsy and immediate breast reconstruction using tissue expander and Flex HD with Dr. Ulice Bold.  Risks, benefits, and alternatives of procedure discussed, questions answered and consent obtained.    Smoking Status: Non-smoker. Last Mammogram: 12/2022; Results: BI-RADS Category 5 highly suggestive of right breast malignancy.  Since then biopsy-proven positive.  Caprini Score: 7; Risk Factors include: Age, BMI greater than 25, history of cancer, and length of planned surgery. Recommendation for mechanical and possibly pharmacological prophylaxis.  Will discuss with surgeon and prescribe Lovenox if indicated, otherwise encourage early ambulation.   Pictures obtained: 03/08/2023  Post-op Rx sent to pharmacy: Oxycodone, Zofran, Valium, Keflex.  Discussed the side effects of Valium and oxycodone.  Advised caution.  Patient was provided with the General Surgical Risk consent document and Pain Medication Agreement prior to their appointment.  They had adequate time to read  through the risk consent documents and Pain Medication Agreement. We also discussed them in person together during this preop appointment. All of their questions were answered to their satisfaction.  Recommended  calling if they have any further questions.  Risk consent form and Pain Medication Agreement to be scanned into patient's chart.  The risks that can be encountered with and after placement of a breast expander placement were discussed and include the following but not limited to these: bleeding, infection, delayed healing, anesthesia risks, skin sensation changes, injury to structures including nerves, blood vessels, and muscles which may be temporary or permanent, allergies to tape, suture materials and glues, blood products, topical preparations or injected agents, skin contour irregularities, skin discoloration and swelling, deep vein thrombosis, cardiac and pulmonary complications, pain, which may persist, fluid accumulation, wrinkling of the skin over the expander, changes in nipple or breast sensation, expander leakage or rupture, faulty position of the expander, persistent pain, formation of tight scar tissue around the expander (capsular contracture), possible need for revisional surgery or staged procedures.    Electronically signed by: Evelena Leyden, PA-C 03/29/2023 2:35 PM

## 2023-03-29 NOTE — H&P (View-Only) (Signed)
 Patient ID: Krystal Bennett, female    DOB: 08/18/1956, 67 y.o.   MRN: [id number]  Chief Complaint  Patient presents with   Pre-op Exam      ICD-10-CM   1. Ductal carcinoma in situ (DCIS) of right breast  D05.11        History of Present Illness: Krystal Bennett is a 67 y.o.  female  with a history of right breast DCIS.  She presents for preoperative evaluation for upcoming procedure, right breast mastectomy with sentinel lymph node biopsy and immediate breast reconstruction using tissue expander and Flex HD, scheduled for 04/18/2023 with Dr. Ulice Bold.  The patient has not had problems with anesthesia.  Previous thyroidectomy without complication.  She denies any personal or family history of blood clots or clotting disorder.  She denies any varicosities.  No nicotine use.  Denies any significant cardiac disease aside from her high blood pressure and high cholesterol.  She states that she has whitecoat syndrome which contributes to elevated blood pressures in clinic.  Today it is quite elevated at 182/98, but she denies any cardiac symptoms.  She did take her losartan earlier this morning, but is admittedly anxious about her upcoming surgery.  Emphasized the importance of checking her blood pressure regularly at home and following up with her PCP if it is not improved by the end of the day.  She will hold her vitamins and supplements prior to surgery.  Discussed the risks of breast reconstruction and patient expressed understanding and is agreeable.  She states that she wants to remain the same cup size.  Summary of Previous Visit: She was seen for consult by Dr. Ulice Bold on 03/08/2023.  At that time, she had recently been diagnosed with right breast DCIS.  She is a B cup preoperatively.  Likely will not be able to salvage NAC with right-sided mastectomy given size of calcifications.  Previous surgeries without complication or difficulty from anesthesia.  Discussed options for reconstruction  and patient like to proceed with implant-based reconstruction.  She understands that it is a 2 stage surgery.  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  She has already spoken with her employer and will let us know if any paperwork is required.  PMH Significant for: Right breast DCIS, HTN, HLD, thyroid cancer s/p thyroidectomy.   Past Medical History: Allergies: No Known Allergies  Current Medications:  Current Outpatient Medications:    atorvastatin (LIPITOR) 20 MG tablet, Take 0.5 tablets by mouth daily at 6 PM., Disp: , Rfl:    atorvastatin (LIPITOR) 40 MG tablet, Take 40 mg by mouth daily., Disp: , Rfl:    b complex vitamins capsule, Take 1 capsule by mouth daily., Disp: , Rfl:    Calcium Carb-Cholecalciferol (CALCIUM HIGH POTENCY/VITAMIN D) 600-5 MG-MCG TABS, Take by mouth., Disp: , Rfl:    cephALEXin (KEFLEX) 500 MG capsule, Take 1 capsule (500 mg total) by mouth 4 (four) times daily for 3 days., Disp: 12 capsule, Rfl: 0   Cholecalciferol (VITAMIN D-3) 125 MCG (5000 UT) TABS, Take 125 mcg by mouth daily., Disp: , Rfl:    COLLAGEN PO, Take 1 each by mouth daily. Vital Proteins Collagen Peptides, Disp: , Rfl:    diazepam (VALIUM) 2 MG tablet, Take 1 tablet (2 mg total) by mouth every 12 (twelve) hours as needed for muscle spasms., Disp: 20 tablet, Rfl: 0   levothyroxine (SYNTHROID) 88 MCG tablet, Take 88 mcg by mouth daily., Disp: , Rfl:  losartan (COZAAR) 100 MG tablet, Take 100 mg by mouth daily., Disp: , Rfl:    Multiple Vitamins-Minerals (MULTIVITAMIN WOMENS 50+ ADV PO), Take 2 tablets by mouth daily., Disp: , Rfl:    NON FORMULARY, Take 2 tablets by mouth daily. Propolis Complete Gut Health, Disp: , Rfl:    NON FORMULARY, Take by mouth daily. Propolis, Royal Jelly, Bee Pollin, Disp: , Rfl:    Omega-3 Fatty Acids (FISH OIL) 1000 MG CAPS, Take 2 capsules by mouth daily., Disp: , Rfl:    ondansetron (ZOFRAN-ODT) 4 MG disintegrating tablet, Take 1 tablet (4 mg total) by  mouth every 8 (eight) hours as needed for nausea or vomiting., Disp: 20 tablet, Rfl: 0   oxyCODONE (ROXICODONE) 5 MG immediate release tablet, Take 1 tablet (5 mg total) by mouth every 6 (six) hours as needed for up to 5 days for severe pain., Disp: 20 tablet, Rfl: 0  Past Medical Problems: Past Medical History:  Diagnosis Date   HTN (hypertension)    Hyperlipidemia     Past Surgical History: Past Surgical History:  Procedure Laterality Date   BREAST BIOPSY Right 02/03/2023   stereo calcs #1 posterior group, coil marker, path pending   BREAST BIOPSY Right 02/03/2023   stereo bx calcs #2 anterior group, x marker, path pending   BREAST BIOPSY Right 02/03/2023   MM RT BREAST BX W LOC DEV EA AD LESION IMG BX SPEC STEREO GUIDE 02/03/2023 [facility]   BREAST BIOPSY Right 02/03/2023   MM RT BREAST BX W LOC DEV 1ST LESION IMAGE BX SPEC STEREO GUIDE 02/03/2023 [facility]   CESAREAN SECTION     THYROIDECTOMY N/A 10/04/2016   Procedure: THYROIDECTOMY;  Surgeon: Vernie Murders, MD;  Location: [facility];  Service: ENT;  Laterality: N/A;   WISDOM TOOTH EXTRACTION      Social History: Social History   Socioeconomic History   Marital status: Single    Spouse name: Not on file   Number of children: Not on file   Years of education: Not on file   Highest education level: Not on file  Occupational History   Not on file  Tobacco Use   Smoking status: Never    Passive exposure: Never   Smokeless tobacco: Never  Vaping Use   Vaping status: Never Used  Substance and Sexual Activity   Alcohol use: Yes    Comment: OCC WINE   Drug use: No   Sexual activity: Not on file  Other Topics Concern   Not on file  Social History Narrative   Not on file   Social Determinants of Health   Financial Resource Strain: Low Risk  (08/05/2017)   Received from [facility], [facility]   Overall Financial Resource Strain (CARDIA)    Difficulty of Paying Living  Expenses: Not hard at all  Food Insecurity: No Food Insecurity (02/10/2023)   Hunger Vital Sign    Worried About Running Out of Food in the Last Year: Never true    Ran Out of Food in the Last Year: Never true  Transportation Needs: No Transportation Needs (02/10/2023)   PRAPARE - Administrator, Civil Service (Medical): No    Lack of Transportation (Non-Medical): No  Physical Activity: Insufficiently Active (08/05/2017)   Received from [facility], [facility]   Exercise Vital Sign    Days of Exercise per Week: 3 days    Minutes of Exercise per Session: 40 min  Stress:  No Stress Concern Present (08/05/2017)   Received from [facility], [facility]   [employer] of Occupational Health - Occupational Stress Questionnaire    Feeling of Stress : Not at all  Social Connections: Moderately Integrated (08/05/2017)   Received from [facility], [facility]   Social Connection and Isolation Panel [NHANES]    Frequency of Communication with Friends and Family: More than three times a week    Frequency of Social Gatherings with Friends and Family: More than three times a week    Attends Religious Services: More than 4 times per year    Active Member of [employer] or Organizations: Yes    Attends Banker Meetings: 1 to 4 times per year    Marital Status: Divorced  Intimate Partner Violence: Not At Risk (02/10/2023)   Humiliation, Afraid, Rape, and Kick questionnaire    Fear of Current or Ex-Partner: No    Emotionally Abused: No    Physically Abused: No    Sexually Abused: No    Family History: No family history on file.  Review of Systems: ROS Endorses some discomfort at the biopsy site, but otherwise no chest pain, Diffley breathing, leg swelling, or fevers.  Physical Exam: Vital Signs BP (!) 182/98 (BP Location: Left Arm, Patient Position: Sitting, Cuff  Size: Normal)   Pulse 66   SpO2 97%   Physical Exam Constitutional:      General: Not in acute distress.    Appearance: Normal appearance. Not ill-appearing.  HENT:     Head: Normocephalic and atraumatic.  Eyes:     Pupils: Pupils are equal, round. Cardiovascular:     Rate and Rhythm: Normal rate.    Pulses: Normal pulses.  Pulmonary:     Effort: No respiratory distress or increased work of breathing.  Speaks in full sentences. Abdominal:     General: Abdomen is flat. No distension.   Musculoskeletal: Normal range of motion. No lower extremity swelling or edema. No varicosities. Skin:    General: Skin is warm and dry.     Findings: No erythema or rash.  Neurological:     Mental Status: Alert and oriented to person, place, and time.  Psychiatric:        Mood and Affect: Mood normal.        Behavior: Behavior normal.    Assessment/Plan: The patient is scheduled for right breast mastectomy with sentinel lymph node biopsy and immediate breast reconstruction using tissue expander and Flex HD with Dr. Ulice Bold.  Risks, benefits, and alternatives of procedure discussed, questions answered and consent obtained.    Smoking Status: Non-smoker. Last Mammogram: 12/2022; Results: BI-RADS Category 5 highly suggestive of right breast malignancy.  Since then biopsy-proven positive.  Caprini Score: 7; Risk Factors include: Age, BMI greater than 25, history of cancer, and length of planned surgery. Recommendation for mechanical and possibly pharmacological prophylaxis.  Will discuss with surgeon and prescribe Lovenox if indicated, otherwise encourage early ambulation.   Pictures obtained: 03/08/2023  Post-op Rx sent to pharmacy: Oxycodone, Zofran, Valium, Keflex.  Discussed the side effects of Valium and oxycodone.  Advised caution.  Patient was provided with the General Surgical Risk consent document and Pain Medication Agreement prior to their appointment.  They had adequate time to read  through the risk consent documents and Pain Medication Agreement. We also discussed them in person together during this preop appointment. All of their questions were answered to their satisfaction.  Recommended  calling if they have any further questions.  Risk consent form and Pain Medication Agreement to be scanned into patient's chart.  The risks that can be encountered with and after placement of a breast expander placement were discussed and include the following but not limited to these: bleeding, infection, delayed healing, anesthesia risks, skin sensation changes, injury to structures including nerves, blood vessels, and muscles which may be temporary or permanent, allergies to tape, suture materials and glues, blood products, topical preparations or injected agents, skin contour irregularities, skin discoloration and swelling, deep vein thrombosis, cardiac and pulmonary complications, pain, which may persist, fluid accumulation, wrinkling of the skin over the expander, changes in nipple or breast sensation, expander leakage or rupture, faulty position of the expander, persistent pain, formation of tight scar tissue around the expander (capsular contracture), possible need for revisional surgery or staged procedures.    Electronically signed by: Evelena Leyden, PA-C 03/29/2023 2:35 PM

## 2023-04-11 ENCOUNTER — Encounter (HOSPITAL_BASED_OUTPATIENT_CLINIC_OR_DEPARTMENT_OTHER): Payer: Self-pay | Admitting: General Surgery

## 2023-04-13 ENCOUNTER — Encounter (HOSPITAL_BASED_OUTPATIENT_CLINIC_OR_DEPARTMENT_OTHER)
Admission: RE | Admit: 2023-04-13 | Discharge: 2023-04-13 | Disposition: A | Discharge status: Home / Self Care | Payer: Medicare HMO | Source: Ambulatory Visit | Attending: General Surgery | Admitting: General Surgery

## 2023-04-13 ENCOUNTER — Other Ambulatory Visit: Payer: Self-pay

## 2023-04-13 DIAGNOSIS — Z01810 Encounter for preprocedural cardiovascular examination: Secondary | ICD-10-CM | POA: Insufficient documentation

## 2023-04-13 DIAGNOSIS — Z01818 Encounter for other preprocedural examination: Secondary | ICD-10-CM | POA: Diagnosis present

## 2023-04-13 DIAGNOSIS — D0511 Intraductal carcinoma in situ of right breast: Secondary | ICD-10-CM | POA: Insufficient documentation

## 2023-04-13 MED ORDER — CHLORHEXIDINE GLUCONATE CLOTH 2 % EX PADS: 6.0000 | MEDICATED_PAD | Freq: Once | CUTANEOUS | Status: DC

## 2023-04-13 NOTE — Progress Notes (Signed)

## 2023-04-18 ENCOUNTER — Ambulatory Visit (HOSPITAL_BASED_OUTPATIENT_CLINIC_OR_DEPARTMENT_OTHER): Payer: Medicare HMO | Admitting: Anesthesiology

## 2023-04-18 ENCOUNTER — Observation Stay (HOSPITAL_BASED_OUTPATIENT_CLINIC_OR_DEPARTMENT_OTHER)
Admission: RE | Admit: 2023-04-18 | Discharge: 2023-04-19 | Disposition: A | Discharge status: Home / Self Care | Payer: Medicare HMO | Attending: Plastic Surgery | Admitting: Plastic Surgery

## 2023-04-18 ENCOUNTER — Encounter (HOSPITAL_BASED_OUTPATIENT_CLINIC_OR_DEPARTMENT_OTHER): Payer: Self-pay | Admitting: General Surgery

## 2023-04-18 ENCOUNTER — Other Ambulatory Visit: Payer: Self-pay

## 2023-04-18 ENCOUNTER — Encounter (HOSPITAL_COMMUNITY)
Admission: RE | Admit: 2023-04-18 | Discharge: 2023-04-18 | Disposition: A | Discharge status: Home / Self Care | Payer: Medicare HMO | Source: Ambulatory Visit | Attending: General Surgery | Admitting: General Surgery

## 2023-04-18 ENCOUNTER — Encounter (HOSPITAL_BASED_OUTPATIENT_CLINIC_OR_DEPARTMENT_OTHER)
Admission: RE | Disposition: A | Discharge status: Home / Self Care | Payer: Self-pay | Source: Home / Self Care | Attending: Plastic Surgery

## 2023-04-18 DIAGNOSIS — Z79899 Other long term (current) drug therapy: Secondary | ICD-10-CM | POA: Insufficient documentation

## 2023-04-18 DIAGNOSIS — Z1231 Encounter for screening mammogram for malignant neoplasm of breast: Secondary | ICD-10-CM | POA: Diagnosis not present

## 2023-04-18 DIAGNOSIS — D0511 Intraductal carcinoma in situ of right breast: Secondary | ICD-10-CM

## 2023-04-18 DIAGNOSIS — E039 Hypothyroidism, unspecified: Secondary | ICD-10-CM | POA: Insufficient documentation

## 2023-04-18 DIAGNOSIS — Z01818 Encounter for other preprocedural examination: Principal | ICD-10-CM

## 2023-04-18 DIAGNOSIS — I10 Essential (primary) hypertension: Secondary | ICD-10-CM | POA: Insufficient documentation

## 2023-04-18 DIAGNOSIS — C50919 Malignant neoplasm of unspecified site of unspecified female breast: Secondary | ICD-10-CM

## 2023-04-18 DIAGNOSIS — Z421 Encounter for breast reconstruction following mastectomy: Secondary | ICD-10-CM

## 2023-04-18 DIAGNOSIS — Z85850 Personal history of malignant neoplasm of thyroid: Secondary | ICD-10-CM | POA: Diagnosis not present

## 2023-04-18 HISTORY — PX: MASTECTOMY W/ SENTINEL NODE BIOPSY: SHX2001

## 2023-04-18 HISTORY — PX: BREAST RECONSTRUCTION WITH PLACEMENT OF TISSUE EXPANDER AND FLEX HD (ACELLULAR HYDRATED DERMIS): SHX6295

## 2023-04-18 HISTORY — PX: MASTECTOMY: SHX3

## 2023-04-18 SURGERY — MASTECTOMY WITH SENTINEL LYMPH NODE BIOPSY
Anesthesia: General | Site: Breast | Laterality: Right

## 2023-04-18 MED ORDER — LIDOCAINE HCL (CARDIAC) PF 100 MG/5ML IV SOSY
PREFILLED_SYRINGE | INTRAVENOUS | Status: DC | PRN
  Administered 2023-04-18: 60 mg via INTRAVENOUS

## 2023-04-18 MED ORDER — PROPOFOL 10 MG/ML IV BOLUS
INTRAVENOUS | Status: AC
  Filled 2023-04-18: qty 20

## 2023-04-18 MED ORDER — DEXAMETHASONE SODIUM PHOSPHATE 10 MG/ML IJ SOLN
INTRAMUSCULAR | Status: DC | PRN
  Administered 2023-04-18: 5 mg via INTRAVENOUS

## 2023-04-18 MED ORDER — EPHEDRINE SULFATE (PRESSORS) 50 MG/ML IJ SOLN
INTRAMUSCULAR | Status: DC | PRN
  Administered 2023-04-18: 10 mg via INTRAVENOUS

## 2023-04-18 MED ORDER — FENTANYL CITRATE (PF) 100 MCG/2ML IJ SOLN
INTRAMUSCULAR | Status: DC | PRN
  Administered 2023-04-18: 25 ug via INTRAVENOUS
  Administered 2023-04-18: 50 ug via INTRAVENOUS

## 2023-04-18 MED ORDER — DIPHENHYDRAMINE HCL 50 MG/ML IJ SOLN: 12.5000 mg | Freq: Four times a day (QID) | INTRAMUSCULAR | Status: DC | PRN

## 2023-04-18 MED ORDER — HYDROMORPHONE HCL 1 MG/ML IJ SOLN
INTRAMUSCULAR | Status: AC
  Filled 2023-04-18: qty 0.5

## 2023-04-18 MED ORDER — MIDAZOLAM HCL 2 MG/2ML IJ SOLN
INTRAMUSCULAR | Status: AC
  Filled 2023-04-18: qty 2

## 2023-04-18 MED ORDER — CEFAZOLIN SODIUM-DEXTROSE 2-4 GM/100ML-% IV SOLN: 2.0000 g | INTRAVENOUS | Status: AC

## 2023-04-18 MED ORDER — CEFAZOLIN SODIUM-DEXTROSE 2-4 GM/100ML-% IV SOLN
2.0000 g | Freq: Three times a day (TID) | INTRAVENOUS | Status: DC
  Administered 2023-04-18: 2 g via INTRAVENOUS
  Filled 2023-04-18 (×2): qty 100

## 2023-04-18 MED ORDER — IBUPROFEN 200 MG PO TABS
400.0000 mg | ORAL_TABLET | Freq: Four times a day (QID) | ORAL | Status: DC
  Administered 2023-04-18: 400 mg via ORAL
  Filled 2023-04-18: qty 2

## 2023-04-18 MED ORDER — DIPHENHYDRAMINE HCL 12.5 MG/5ML PO ELIX: 12.5000 mg | ORAL_SOLUTION | Freq: Four times a day (QID) | ORAL | Status: DC | PRN

## 2023-04-18 MED ORDER — ONDANSETRON HCL 4 MG/2ML IJ SOLN: 4.0000 mg | Freq: Once | INTRAMUSCULAR | Status: DC | PRN

## 2023-04-18 MED ORDER — TECHNETIUM TC 99M TILMANOCEPT KIT: 1.0000 | PACK | Freq: Once | INTRAVENOUS | Status: DC | PRN

## 2023-04-18 MED ORDER — HYDROMORPHONE HCL 1 MG/ML IJ SOLN
INTRAMUSCULAR | Status: DC | PRN
Start: 2023-04-18 — End: 2023-04-18
  Administered 2023-04-18: .5 mg via INTRAVENOUS

## 2023-04-18 MED ORDER — OXYCODONE HCL 5 MG PO TABS: 5.0000 mg | ORAL_TABLET | Freq: Once | ORAL | Status: DC | PRN

## 2023-04-18 MED ORDER — OXYCODONE HCL 5 MG PO TABS: 5.0000 mg | ORAL_TABLET | ORAL | Status: DC | PRN

## 2023-04-18 MED ORDER — ONDANSETRON HCL 4 MG/2ML IJ SOLN
4.0000 mg | Freq: Four times a day (QID) | INTRAMUSCULAR | Status: DC | PRN
  Administered 2023-04-18: 4 mg via INTRAVENOUS
  Filled 2023-04-18: qty 2

## 2023-04-18 MED ORDER — MIDAZOLAM HCL 2 MG/2ML IJ SOLN
2.0000 mg | Freq: Once | INTRAMUSCULAR | Status: AC
  Administered 2023-04-18: 2 mg via INTRAVENOUS

## 2023-04-18 MED ORDER — CEFAZOLIN SODIUM-DEXTROSE 2-3 GM-%(50ML) IV SOLR
INTRAVENOUS | Status: DC | PRN
  Administered 2023-04-18: 2 g via INTRAVENOUS

## 2023-04-18 MED ORDER — HYDROMORPHONE HCL 1 MG/ML IJ SOLN: 1.0000 mg | INTRAMUSCULAR | Status: DC | PRN

## 2023-04-18 MED ORDER — OXYCODONE HCL 5 MG/5ML PO SOLN: 5.0000 mg | Freq: Once | ORAL | Status: DC | PRN

## 2023-04-18 MED ORDER — PROPOFOL 10 MG/ML IV BOLUS
INTRAVENOUS | Status: DC | PRN
Start: 2023-04-18 — End: 2023-04-18
  Administered 2023-04-18: 150 mg via INTRAVENOUS

## 2023-04-18 MED ORDER — GABAPENTIN 300 MG PO CAPS
300.0000 mg | ORAL_CAPSULE | ORAL | Status: AC
  Administered 2023-04-18: 300 mg via ORAL

## 2023-04-18 MED ORDER — BUPIVACAINE LIPOSOME 1.3 % IJ SUSP
INTRAMUSCULAR | Status: DC | PRN
  Administered 2023-04-18: 10 mL via PERINEURAL

## 2023-04-18 MED ORDER — TECHNETIUM TC 99M TILMANOCEPT KIT
1.0000 | PACK | Freq: Once | INTRAVENOUS | Status: AC | PRN
  Administered 2023-04-18: 1 via INTRADERMAL

## 2023-04-18 MED ORDER — SENNA 8.6 MG PO TABS
1.0000 | ORAL_TABLET | Freq: Two times a day (BID) | ORAL | Status: DC
  Filled 2023-04-18: qty 1

## 2023-04-18 MED ORDER — LIDOCAINE 2% (20 MG/ML) 5 ML SYRINGE
INTRAMUSCULAR | Status: AC
  Filled 2023-04-18: qty 5

## 2023-04-18 MED ORDER — GABAPENTIN 300 MG PO CAPS
ORAL_CAPSULE | ORAL | Status: AC
  Filled 2023-04-18: qty 1

## 2023-04-18 MED ORDER — ACETAMINOPHEN 325 MG PO TABS
325.0000 mg | ORAL_TABLET | Freq: Four times a day (QID) | ORAL | Status: DC
  Administered 2023-04-18 – 2023-04-19 (×2): 325 mg via ORAL
  Filled 2023-04-18 (×2): qty 1

## 2023-04-18 MED ORDER — POLYETHYLENE GLYCOL 3350 17 G PO PACK: 17.0000 g | PACK | Freq: Every day | ORAL | Status: DC | PRN

## 2023-04-18 MED ORDER — ONDANSETRON HCL 4 MG/2ML IJ SOLN
INTRAMUSCULAR | Status: AC
  Filled 2023-04-18: qty 2

## 2023-04-18 MED ORDER — LACTATED RINGERS IV SOLN: INTRAVENOUS | Status: DC | PRN

## 2023-04-18 MED ORDER — VASHE WOUND IRRIGATION OPTIME
TOPICAL | Status: DC | PRN
  Administered 2023-04-18: 34 [oz_av]

## 2023-04-18 MED ORDER — ACETAMINOPHEN 500 MG PO TABS
1000.0000 mg | ORAL_TABLET | ORAL | Status: AC
  Administered 2023-04-18: 1000 mg via ORAL

## 2023-04-18 MED ORDER — BUPIVACAINE HCL (PF) 0.5 % IJ SOLN
INTRAMUSCULAR | Status: DC | PRN
  Administered 2023-04-18: 20 mL via PERINEURAL

## 2023-04-18 MED ORDER — ACETAMINOPHEN 500 MG PO TABS
ORAL_TABLET | ORAL | Status: AC
  Filled 2023-04-18: qty 2

## 2023-04-18 MED ORDER — ZOLPIDEM TARTRATE 5 MG PO TABS: 5.0000 mg | ORAL_TABLET | Freq: Every evening | ORAL | Status: DC | PRN

## 2023-04-18 MED ORDER — DIAZEPAM 2 MG PO TABS: 2.0000 mg | ORAL_TABLET | Freq: Two times a day (BID) | ORAL | Status: DC | PRN

## 2023-04-18 MED ORDER — AMISULPRIDE (ANTIEMETIC) 5 MG/2ML IV SOLN: 10.0000 mg | Freq: Once | INTRAVENOUS | Status: DC | PRN

## 2023-04-18 MED ORDER — LACTATED RINGERS IV SOLN: INTRAVENOUS | Status: DC

## 2023-04-18 MED ORDER — FENTANYL CITRATE (PF) 100 MCG/2ML IJ SOLN
INTRAMUSCULAR | Status: AC
  Filled 2023-04-18: qty 2

## 2023-04-18 MED ORDER — FENTANYL CITRATE (PF) 100 MCG/2ML IJ SOLN
100.0000 ug | Freq: Once | INTRAMUSCULAR | Status: AC
  Administered 2023-04-18: 100 ug via INTRAVENOUS

## 2023-04-18 MED ORDER — KCL IN DEXTROSE-NACL 20-5-0.45 MEQ/L-%-% IV SOLN
INTRAVENOUS | Status: DC
  Filled 2023-04-18: qty 1000

## 2023-04-18 MED ORDER — ONDANSETRON HCL 4 MG/2ML IJ SOLN
INTRAMUSCULAR | Status: DC | PRN
  Administered 2023-04-18: 4 mg via INTRAVENOUS

## 2023-04-18 MED ORDER — DEXAMETHASONE SODIUM PHOSPHATE 10 MG/ML IJ SOLN
INTRAMUSCULAR | Status: AC
  Filled 2023-04-18: qty 1

## 2023-04-18 MED ORDER — ONDANSETRON 4 MG PO TBDP: 4.0000 mg | ORAL_TABLET | Freq: Four times a day (QID) | ORAL | Status: DC | PRN

## 2023-04-18 MED ORDER — CHLORHEXIDINE GLUCONATE CLOTH 2 % EX PADS: 6.0000 | MEDICATED_PAD | Freq: Once | CUTANEOUS | Status: DC

## 2023-04-18 MED ORDER — CEFAZOLIN SODIUM-DEXTROSE 2-4 GM/100ML-% IV SOLN
2.0000 g | INTRAVENOUS | Status: AC
  Administered 2023-04-18: 2 g via INTRAVENOUS
  Filled 2023-04-18: qty 100

## 2023-04-18 MED ORDER — CEFAZOLIN SODIUM-DEXTROSE 2-4 GM/100ML-% IV SOLN
INTRAVENOUS | Status: AC
  Filled 2023-04-18: qty 100

## 2023-04-18 MED ORDER — PHENYLEPHRINE HCL (PRESSORS) 10 MG/ML IV SOLN
INTRAVENOUS | Status: DC | PRN
  Administered 2023-04-18 (×10): 80 ug via INTRAVENOUS

## 2023-04-18 MED ORDER — HYDROMORPHONE HCL 1 MG/ML IJ SOLN
0.2500 mg | INTRAMUSCULAR | Status: DC | PRN
  Administered 2023-04-18 (×2): 0.5 mg via INTRAVENOUS

## 2023-04-18 SURGICAL SUPPLY — 91 items
ADH SKN CLS APL DERMABOND .7 (GAUZE/BANDAGES/DRESSINGS) ×1
APL PRP STRL LF DISP 70% ISPRP (MISCELLANEOUS) ×1
APPLIER CLIP 9.375 MED OPEN (MISCELLANEOUS) ×1
APR CLP MED 9.3 20 MLT OPN (MISCELLANEOUS) ×1
BAG DECANTER FOR FLEXI CONT (MISCELLANEOUS) ×1 IMPLANT
BINDER BREAST LRG (GAUZE/BANDAGES/DRESSINGS) IMPLANT
BINDER BREAST MEDIUM (GAUZE/BANDAGES/DRESSINGS) IMPLANT
BINDER BREAST XLRG (GAUZE/BANDAGES/DRESSINGS) IMPLANT
BINDER BREAST XXLRG (GAUZE/BANDAGES/DRESSINGS) IMPLANT
BIOPATCH RED 1 DISK 7.0 (GAUZE/BANDAGES/DRESSINGS) ×1 IMPLANT
BLADE HEX COATED 2.75 (ELECTRODE) ×1 IMPLANT
BLADE SURG 10 STRL SS (BLADE) ×1 IMPLANT
BLADE SURG 15 STRL LF DISP TIS (BLADE) ×1 IMPLANT
BLADE SURG 15 STRL SS (BLADE) ×2
BNDG GAUZE DERMACEA FLUFF 4 (GAUZE/BANDAGES/DRESSINGS) ×2 IMPLANT
BNDG GZE DERMACEA 4 6PLY (GAUZE/BANDAGES/DRESSINGS)
CANISTER SUCT 1200ML W/VALVE (MISCELLANEOUS) ×1 IMPLANT
CHLORAPREP W/TINT 26 (MISCELLANEOUS) ×1 IMPLANT
CLIP APPLIE 9.375 MED OPEN (MISCELLANEOUS) ×1 IMPLANT
COVER BACK TABLE 60X90IN (DRAPES) ×1 IMPLANT
COVER MAYO STAND STRL (DRAPES) ×1 IMPLANT
COVER PROBE CYLINDRICAL 5X96 (MISCELLANEOUS) ×1 IMPLANT
DERMABOND ADVANCED .7 DNX12 (GAUZE/BANDAGES/DRESSINGS) ×1 IMPLANT
DEVICE DSSCT PLSMBLD 3.0S LGHT (MISCELLANEOUS) ×1 IMPLANT
DRAIN CHANNEL 19F RND (DRAIN) ×1 IMPLANT
DRAPE LAPAROSCOPIC ABDOMINAL (DRAPES) ×1 IMPLANT
DRAPE UTILITY XL STRL (DRAPES) ×1 IMPLANT
DRSG OPSITE POSTOP 4X6 (GAUZE/BANDAGES/DRESSINGS) IMPLANT
DRSG TEGADERM 2-3/8X2-3/4 SM (GAUZE/BANDAGES/DRESSINGS) ×1 IMPLANT
ELECT BLADE 4.0 EZ CLEAN MEGAD (MISCELLANEOUS) ×1
ELECT COATED BLADE 2.86 ST (ELECTRODE) IMPLANT
ELECT REM PT RETURN 9FT ADLT (ELECTROSURGICAL) ×2
ELECTRODE BLDE 4.0 EZ CLN MEGD (MISCELLANEOUS) ×1 IMPLANT
ELECTRODE REM PT RTRN 9FT ADLT (ELECTROSURGICAL) ×1 IMPLANT
EVACUATOR SILICONE 100CC (DRAIN) ×1 IMPLANT
FUNNEL KELLER 2 DISP (MISCELLANEOUS) IMPLANT
GAUZE PAD ABD 8X10 STRL (GAUZE/BANDAGES/DRESSINGS) ×2 IMPLANT
GAUZE SPONGE 4X4 12PLY STRL LF (GAUZE/BANDAGES/DRESSINGS) ×1 IMPLANT
GLOVE BIO SURGEON STRL SZ 6.5 (GLOVE) ×2 IMPLANT
GLOVE BIO SURGEON STRL SZ7 (GLOVE) IMPLANT
GLOVE BIO SURGEON STRL SZ7.5 (GLOVE) ×1 IMPLANT
GLOVE BIOGEL PI IND STRL 7.0 (GLOVE) IMPLANT
GLOVE BIOGEL PI IND STRL 7.5 (GLOVE) IMPLANT
GLOVE BIOGEL PI IND STRL 8 (GLOVE) IMPLANT
GLOVE BIOGEL PI IND STRL 8.5 (GLOVE) IMPLANT
GOWN STRL REUS W/ TWL LRG LVL3 (GOWN DISPOSABLE) ×3 IMPLANT
GOWN STRL REUS W/ TWL XL LVL3 (GOWN DISPOSABLE) IMPLANT
GOWN STRL REUS W/TWL LRG LVL3 (GOWN DISPOSABLE) ×4
GOWN STRL REUS W/TWL XL LVL3 (GOWN DISPOSABLE) ×2
HEMOSTAT ARISTA ABSORB 3G PWDR (HEMOSTASIS) IMPLANT
IMPL EXPANDER BREAST 535CC (Breast) IMPLANT
IMPLANT EXPANDER BREAST 535CC (Breast) ×1 IMPLANT
IV NS 1000ML (IV SOLUTION)
IV NS 1000ML BAXH (IV SOLUTION) IMPLANT
IV NS 500ML (IV SOLUTION) ×1
IV NS 500ML BAXH (IV SOLUTION) ×1 IMPLANT
KIT FILL ASEPTIC TRANSFER (MISCELLANEOUS) IMPLANT
NDL HYPO 25X1 1.5 SAFETY (NEEDLE) ×1 IMPLANT
NEEDLE HYPO 25X1 1.5 SAFETY (NEEDLE) ×1
NS IRRIG 1000ML POUR BTL (IV SOLUTION) ×1 IMPLANT
PACK BASIN DAY SURGERY FS (CUSTOM PROCEDURE TRAY) ×1 IMPLANT
PAD FOAM SILICONE BACKED (GAUZE/BANDAGES/DRESSINGS) IMPLANT
PENCIL SMOKE EVACUATOR (MISCELLANEOUS) ×1 IMPLANT
PIN SAFETY STERILE (MISCELLANEOUS) ×1 IMPLANT
PLASMABLADE 3.0S W/LIGHT (MISCELLANEOUS) ×1
SLEEVE SCD COMPRESS KNEE MED (STOCKING) ×1 IMPLANT
SPIKE FLUID TRANSFER (MISCELLANEOUS) IMPLANT
SPONGE T-LAP 18X18 ~~LOC~~+RFID (SPONGE) ×2 IMPLANT
STRIP SUTURE WOUND CLOSURE 1/2 (MISCELLANEOUS) IMPLANT
SUT ETHILON 2 0 FS 18 (SUTURE) ×1 IMPLANT
SUT MNCRL AB 4-0 PS2 18 (SUTURE) ×1 IMPLANT
SUT MON AB 3-0 SH 27 (SUTURE) ×1
SUT MON AB 3-0 SH27 (SUTURE) ×1 IMPLANT
SUT MON AB 5-0 PS2 18 (SUTURE) IMPLANT
SUT PDS 3-0 CT2 (SUTURE) ×2
SUT PDS AB 2-0 CT2 27 (SUTURE) IMPLANT
SUT PDS II 3-0 CT2 27 ABS (SUTURE) IMPLANT
SUT SILK 2 0 SH (SUTURE) IMPLANT
SUT SILK 3 0 PS 1 (SUTURE) ×1 IMPLANT
SUT VIC AB 3-0 SH 27 (SUTURE)
SUT VIC AB 3-0 SH 27X BRD (SUTURE) IMPLANT
SUT VICRYL 3-0 CR8 SH (SUTURE) ×1 IMPLANT
SYR BULB IRRIG 60ML STRL (SYRINGE) ×1 IMPLANT
SYR CONTROL 10ML LL (SYRINGE) ×1 IMPLANT
TISSUE FLEXHD PERF PLIAB 8X16 (Tissue) IMPLANT
TOWEL GREEN STERILE FF (TOWEL DISPOSABLE) ×2 IMPLANT
TRAY DSU PREP LF (CUSTOM PROCEDURE TRAY) ×1 IMPLANT
TRAY FOLEY W/BAG SLVR 14FR LF (SET/KITS/TRAYS/PACK) IMPLANT
TUBE CONNECTING 20X1/4 (TUBING) ×1 IMPLANT
UNDERPAD 30X36 HEAVY ABSORB (UNDERPADS AND DIAPERS) ×2 IMPLANT
YANKAUER SUCT BULB TIP NO VENT (SUCTIONS) ×1 IMPLANT

## 2023-04-18 NOTE — Interval H&P Note (Signed)
History and Physical Interval Note:  04/18/2023 11:36 AM  Krystal Bennett  has presented today for surgery, with the diagnosis of RIGHT BREAST DUCTAL CARCINOMA IN SITU.  The various methods of treatment have been discussed with the patient and family. After consideration of risks, benefits and other options for treatment, the patient has consented to  Procedure(s) with comments: RIGHT MASTECTOMY WITH SENTINEL LYMPH NODE BIOPSY (Right) - PEC BLOCK IMMEDIATE BREAST RECONSTRUCTION WITH PLACEMENT OF TISSUE EXPANDER AND FLEX HD (ACELLULAR HYDRATED DERMIS) (Right) as a surgical intervention.  The patient's history has been reviewed, patient examined, no change in status, stable for surgery.  I have reviewed the patient's chart and labs.  Questions were answered to the patient's satisfaction.     [name] 

## 2023-04-18 NOTE — Anesthesia Procedure Notes (Signed)
Procedure Name: LMA Insertion Date/Time: 04/18/2023 9:14 AM  Performed by: Karen Kitchens, CRNAPre-anesthesia Checklist: Patient identified, Emergency Drugs available, Suction available and Patient being monitored Patient Re-evaluated:Patient Re-evaluated prior to induction Oxygen Delivery Method: Circle system utilized Preoxygenation: Pre-oxygenation with 100% oxygen Induction Type: IV induction Ventilation: Mask ventilation without difficulty LMA: LMA inserted LMA Size: 4.0 Number of attempts: 1 Airway Equipment and Method: Bite block Placement Confirmation: positive ETCO2, CO2 detector and breath sounds checked- equal and bilateral Tube secured with: Tape Dental Injury: Teeth and Oropharynx as per pre-operative assessment

## 2023-04-18 NOTE — Anesthesia Procedure Notes (Signed)
Anesthesia Regional Block: Pectoralis block   Pre-Anesthetic Checklist: , timeout performed,  Correct Patient, Correct Site, Correct Laterality,  Correct Procedure, Correct Position, site marked,  Risks and benefits discussed,  Surgical consent,  Pre-op evaluation,  At surgeon's request and post-op pain management  Laterality: Right  Prep: chloraprep       Needles:  Injection technique: Single-shot  Needle Type: Echogenic Stimulator Needle     Needle Length: 10cm  Needle Gauge: 21   Needle insertion depth: 6 cm   Additional Needles:   Procedures:,,,, ultrasound used (permanent image in chart),,    Narrative:  Start time: 04/18/2023 7:52 AM End time: 04/18/2023 7:07 AM Injection made incrementally with aspirations every 5 mL.  Performed by: Personally  Anesthesiologist: Mal Amabile, MD  Additional Notes: Timeout performed. Patient sedated. Relevant anatomy ID'd using [place]. Incremental 2-61ml injection of LA with frequent aspiration. Patient tolerated procedure well.      Right Pectoralis Block

## 2023-04-18 NOTE — Interval H&P Note (Signed)
History and Physical Interval Note:  04/18/2023 8:26 AM  Krystal Bennett  has presented today for surgery, with the diagnosis of RIGHT BREAST DUCTAL CARCINOMA IN SITU.  The various methods of treatment have been discussed with the patient and family. After consideration of risks, benefits and other options for treatment, the patient has consented to  Procedure(s) with comments: RIGHT MASTECTOMY WITH SENTINEL LYMPH NODE BIOPSY (Right) - PEC BLOCK IMMEDIATE BREAST RECONSTRUCTION WITH PLACEMENT OF TISSUE EXPANDER AND FLEX HD (ACELLULAR HYDRATED DERMIS) (Right) as a surgical intervention.  The patient's history has been reviewed, patient examined, no change in status, stable for surgery.  I have reviewed the patient's chart and labs.  Questions were answered to the patient's satisfaction.     Chevis Pretty III

## 2023-04-18 NOTE — Discharge Instructions (Signed)

## 2023-04-18 NOTE — Anesthesia Preprocedure Evaluation (Signed)
Anesthesia Evaluation  Patient identified by MRN, date of birth, ID band Patient awake    Reviewed: Allergy & Precautions, NPO status , Patient's Chart, lab work & pertinent test results  Airway Mallampati: III  TM Distance: >3 FB Neck ROM: Full    Dental  (+) Caps, Chipped   Pulmonary neg pulmonary ROS   Pulmonary exam normal breath sounds clear to auscultation       Cardiovascular hypertension, Normal cardiovascular exam Rhythm:Regular Rate:Normal     Neuro/Psych negative neurological ROS  negative psych ROS   GI/Hepatic negative GI ROS, Neg liver ROS,,,  Endo/Other  Hypothyroidism  Right breast Ca HLD  Renal/GU negative Renal ROS  negative genitourinary   Musculoskeletal negative musculoskeletal ROS (+)    Abdominal   Peds  Hematology negative hematology ROS (+)   Anesthesia Other Findings   Reproductive/Obstetrics                              Anesthesia Physical Anesthesia Plan  ASA: 2  Anesthesia Plan: General   Post-op Pain Management: Regional block* and Minimal or no pain anticipated   Induction: Intravenous  PONV Risk Score and Plan: 4 or greater and Treatment may vary due to age or medical condition, Ondansetron and Dexamethasone  Airway Management Planned: LMA  Additional Equipment: None  Intra-op Plan:   Post-operative Plan: Extubation in OR  Informed Consent: I have reviewed the patients History and Physical, chart, labs and discussed the procedure including the risks, benefits and alternatives for the proposed anesthesia with the patient or authorized representative who has indicated his/her understanding and acceptance.     Dental advisory given  Plan Discussed with: CRNA and Anesthesiologist  Anesthesia Plan Comments:          Anesthesia Quick Evaluation

## 2023-04-18 NOTE — Progress Notes (Signed)
Assisted Dr. Foster with right, pectoralis, ultrasound guided block. Side rails up, monitors on throughout procedure. See vital signs in flow sheet. Tolerated Procedure well. 

## 2023-04-18 NOTE — Anesthesia Postprocedure Evaluation (Signed)
Anesthesia Post Note  Patient: Krystal Bennett  Procedure(s) Performed: RIGHT MASTECTOMY WITH SENTINEL LYMPH NODE BIOPSY (Right: Breast) IMMEDIATE BREAST RECONSTRUCTION WITH PLACEMENT OF TISSUE EXPANDER AND FLEX HD (ACELLULAR HYDRATED DERMIS) (Right: Breast)     Patient location during evaluation: PACU Anesthesia Type: General Level of consciousness: awake and alert and oriented Pain management: pain level controlled Vital Signs Assessment: post-procedure vital signs reviewed and stable Respiratory status: spontaneous breathing, nonlabored ventilation and respiratory function stable Cardiovascular status: blood pressure returned to baseline and stable Postop Assessment: no apparent nausea or vomiting Anesthetic complications: no   No notable events documented.  Last Vitals:  Vitals:   04/18/23 1130 04/18/23 1145  BP: 96/61 116/72  Pulse: 61 62  Resp: 15 15  Temp: (!) 36.2 C   SpO2: 95% 99%    Last Pain:  Vitals:   04/18/23 1130  TempSrc:   PainSc: Asleep                 , A.

## 2023-04-18 NOTE — Op Note (Signed)
Op report    DATE OF OPERATION:  04/18/2023  LOCATION: [facility]  SURGICAL DIVISION: Plastic Surgery  PREOPERATIVE DIAGNOSES:  1. Right Breast cancer.    POSTOPERATIVE DIAGNOSES:  1. Right Breast cancer.   PROCEDURE:  1. Right immediate breast reconstruction with placement of Acellular Dermal Matrix and tissue expanders.  SURGEON: Foster Simpson, DO  ASSISTANT: Keenan Bachelor, PA  ANESTHESIA:  General.   COMPLICATIONS: None.   IMPLANTS: Right - Mentor 535 cc. Ref #[id number].  200 cc of injectable saline placed in the expander. Acellular Dermal Matrix 8 x 16 cm Flex HD  INDICATIONS FOR PROCEDURE:  The patient, Krystal Bennett, is a 67 y.o. female born on June 13, 1956, is here for  immediate first stage breast reconstruction with placement of a right tissue expander and Acellular dermal matrix. MRN: [id number]  CONSENT:  Informed consent was obtained directly from the patient. Risks, benefits and alternatives were fully discussed. Specific risks including but not limited to bleeding, infection, hematoma, seroma, scarring, pain, implant infection, implant extrusion, capsular contracture, asymmetry, wound healing problems, and need for further surgery were all discussed. The patient did have an ample opportunity to have her questions answered to her satisfaction.   DESCRIPTION OF PROCEDURE:  The patient was taken to the operating room by the general surgery team. SCDs were placed and IV antibiotics were given. The patient's chest was prepped and draped in a sterile fashion. A time out was performed and the implants to be used were identified.  A right mastectomy was performed.  Once the general surgery team had completed their portion of the case the patient was rendered to the plastic and reconstructive surgery team.  Right:  The pectoralis major muscle was lifted from the chest wall with release of the lateral edge and lateral inframammary fold.  The  pocket was irrigated with antibiotic solution and hemostasis was achieved with electrocautery.  The ADM was then prepared according to the manufacture guidelines and slits placed to help with postoperative fluid management.  The ADM was then sutured to the inferior and lateral edge of the inframammary fold with 2-0 PDS starting with an interrupted stitch and then a running stitch.  The lateral portion was sutured to with interrupted sutures after the expander was placed.  There was a little ooze laterally so Arista was placed. The expander was prepared according to the manufacture guidelines, the air evacuated and then it was placed under the ADM and pectoralis major muscle.  The inferior and lateral tabs were used to secure the expander to the chest wall with 2-0 PDS.  The drain was placed at the inframammary fold over the ADM and secured to the skin with 3-0 Silk.    The deep layers were closed with 3-0 Monocryl followed by 4-0 Monocryl.  The skin was closed with 5-0 Monocryl and then dermabond was applied.  The ABDs and breast binder were placed.  The patient tolerated the procedure well and there were no complications.  The patient was allowed to wake from anesthesia and taken to the recovery room in satisfactory condition.   The advanced practice practitioner (APP) assisted throughout the case.  The APP was essential in retraction and counter traction when needed to make the case progress smoothly.  This retraction and assistance made it possible to see the tissue plans for the procedure.  The assistance was needed for blood control, tissue re-approximation and assisted with closure of the incision site.

## 2023-04-18 NOTE — Transfer of Care (Signed)
Immediate Anesthesia Transfer of Care Note  Patient: Krystal Bennett  Procedure(s) Performed: RIGHT MASTECTOMY WITH SENTINEL LYMPH NODE BIOPSY (Right: Breast) IMMEDIATE BREAST RECONSTRUCTION WITH PLACEMENT OF TISSUE EXPANDER AND FLEX HD (ACELLULAR HYDRATED DERMIS) (Right: Breast)  Patient Location: PACU  Anesthesia Type:GA combined with regional for post-op pain  Level of Consciousness: sedated  Airway & Oxygen Therapy: Patient Spontanous Breathing and Patient connected to face mask oxygen  Post-op Assessment: Report given to RN and Post -op Vital signs reviewed and stable  Post vital signs: Reviewed and stable  Last Vitals:  Vitals Value Taken Time  BP    Temp    Pulse 66 04/18/23 1115  Resp 19 04/18/23 1115  SpO2 96 % 04/18/23 1115  Vitals shown include unfiled device data.  Last Pain:  Vitals:   04/18/23 0710  TempSrc: Temporal  PainSc: 0-No pain         Complications: No notable events documented.

## 2023-04-18 NOTE — Op Note (Signed)
04/18/2023  10:19 AM  PATIENT:  Krystal Bennett  67 y.o. female  PRE-OPERATIVE DIAGNOSIS:  RIGHT BREAST DUCTAL CARCINOMA IN SITU  POST-OPERATIVE DIAGNOSIS:  RIGHT BREAST DUCTAL CARCINOMA IN SITU  PROCEDURE:  Procedure(s) with comments: RIGHT MASTECTOMY WITH SENTINEL LYMPH NODE BIOPSY   SURGEON:  Surgeons and Role: Panel 1:    Griselda Miner, MD - Primary  PHYSICIAN ASSISTANT:   ASSISTANTS: none   ANESTHESIA:   general  EBL:  65 mL   BLOOD ADMINISTERED:none  DRAINS: none   LOCAL MEDICATIONS USED:  NONE  SPECIMEN:  Source of Specimen:  right mastectomy and sentinel node  DISPOSITION OF SPECIMEN:  PATHOLOGY  COUNTS:  YES  TOURNIQUET:  * No tourniquets in log *  DICTATION: .Dragon Dictation  After informed consent was obtained the patient was brought to the operating room and placed in the supine position on the operating table.  After adequate induction of general anesthesia the patient's bilateral chest, breast, and axillary areas were prepped with ChloraPrep, allowed to dry, and draped in usual sterile manner.  An appropriate timeout was performed.  Earlier in the day the patient underwent injection of 1 mCi of technetium sulfur colloid in the subareolar position on the right.  An elliptical incision was then made around the nipple and areolar complex in order to spare as much skin as possible.  The incision was carried through the skin and subcutaneous tissue sharply with the PlasmaBlade.  Breast hooks were used to elevate the skin flaps anteriorly towards the ceiling.  Thin skin flaps were then created by dissecting between the breast tissue and the subcutaneous fat and skin.  This dissection was carried circumferentially all the way to the chest wall.  Next the breast was removed from the pectoralis muscle with the pectoralis fascia.  Once this part of the dissection was complete then the entire right breast was removed from the patient.  It was marked with a stitch on  the lateral skin and sent to pathology for further evaluation.  Several small vessels and intercostobrachial nerves laterally were controlled with clips.  3 perforating vessels medially were controlled with figure-of-eight 3-0 Vicryl stitches.  The neoprobe was then set to technetium and an area of radioactivity was readily identified in the deep right axillary space.  Dissection was carried towards the radioactivity under the direction of the neoprobe.  I was able to identify a small clump of at least 1 lymph node with significant signal.  This clump of lymph node was excised sharply with the electrocautery and the surrounding small vessels and lymphatics were controlled with clips.  Ex vivo counts on this node was approximately 2000.  No other hot or palpable nodes were identified in the right axilla.  Hemostasis was achieved using the PlasmaBlade.  The patient tolerated this portion of the procedure well.  All needle sponge and instrument counts were correct.  The patient was then turned over to Dr. Ulice Bold for the reconstruction.  Her portion will be dictated separately.  The patient was in stable condition.  PLAN OF CARE: Admit for overnight observation  PATIENT DISPOSITION:  PACU - hemodynamically stable.   Delay start of Pharmacological VTE agent (>24hrs) due to surgical blood loss or risk of bleeding: no

## 2023-04-18 NOTE — H&P (Signed)
REFERRING PHYSICIAN: Piscoya, Peggye Pitt.,* PROVIDER: Lindell Noe, MD MRN: [id number] DOB: May 13, 1956 Subjective   Chief Complaint: New Consultation (BREAST)  History of Present Illness: Krystal Bennett is a 67 y.o. female who is seen today as an office consultation for evaluation of New Consultation (BREAST)  We are asked to see the patient in consultation by Dr. Aleen Campi to evaluate her for a new right breast cancer. The patient is a 67 year old black female who recently went for a routine screening mammogram. At that time she was noted to have an area of calcification in the inner right breast measuring 6.5 cm. 2 representative areas of this were biopsied and came back as high-grade ductal carcinoma in situ. Tumor markers were not checked. She has no family history of breast cancer. She is otherwise in good health and does not smoke.  Review of Systems: A complete review of systems was obtained from the patient. I have reviewed this information and discussed as appropriate with the patient. See HPI as well for other ROS.  ROS   Medical History: Past Medical History:  Diagnosis Date  Cancer (CMS/HHS-HCC)  hurthle cell thyroid tumor  Cataract cortical, senile 2015  Essential hypertension 10/26/2018  Pure hypercholesterolemia  Thyroid disease Dec 2017  thyroid removed Jan 2018   Patient Active Problem List  Diagnosis  Hypercholesterolemia  S/P complete thyroidectomy  Essential hypertension  Vitamin D deficiency  Prediabetes  Hypothyroidism  Ductal carcinoma in situ (DCIS) of right breast   Past Surgical History:  Procedure Laterality Date  COLONOSCOPY 11/22/2008  Dr. Eber Hong @ [facility] - Hyperplastic polyp, melanosis coli, rpt 10 yrs per [facility]  THYROIDECTOMY TOTAL 09/2016  CATARACT EXTRACTION Right 01/02/2021  COLONOSCOPY 06/22/2021  Int. Hemorrhoids/Entire examined colon is normal/Repeat 88yrs/TKT  CESAREAN SECTION    No Known Allergies  Current Outpatient  Medications on File Prior to Visit  Medication Sig Dispense Refill  atorvastatin (LIPITOR) 40 MG tablet Take 1 tablet (40 mg total) by mouth once daily 90 tablet 3  calcium carbonate-vitamin D3 (CALTRATE 600+D) 600 mg(1,500mg ) -200 unit tablet Take 1 tablet by mouth 2 (two) times daily with meals.  levothyroxine (SYNTHROID) 75 MCG tablet Take 1 tablet (75 mcg total) by mouth once daily 90 tablet 3  losartan (COZAAR) 100 MG tablet TAKE 1 TABLET BY MOUTH EVERY DAY 90 tablet 2  cholecalciferol (VITAMIN D3) 2,000 unit capsule Take 2 capsules (4,000 Units total) by mouth once daily 720 capsule 11   No current facility-administered medications on file prior to visit.   Family History  Problem Relation Age of Onset  Hip fracture Mother  High blood pressure (Hypertension) Mother  Prostate cancer Father  Thyroid disease Maternal Aunt    Social History   Tobacco Use  Smoking Status Never  Smokeless Tobacco Never    Social History   Socioeconomic History  Marital status: Single  Number of children: 2  Occupational History  Comment: Works full-time in Theatre manager  Tobacco Use  Smoking status: Never  Smokeless tobacco: Never  Vaping Use  Vaping status: Never Used  Substance and Sexual Activity  Alcohol use: Not Currently  Drug use: No  Sexual activity: Yes  Partners: Male  Birth control/protection: Post-menopausal  Social History Narrative  She lives alone in her own home. No smokers in home. No pets in home. No firearms in home. Smoke and carbon monoxide detectors are up to date.   Social Determinants of Health   Financial Resource Strain: Low Risk (  08/05/2017)  Overall Financial Resource Strain (CARDIA)  Difficulty of Paying Living Expenses: Not hard at all  Food Insecurity: No Food Insecurity (02/10/2023)  [facility]  Hunger Vital Sign  Worried About Running Out of Food in the Last Year: Never true  Ran Out of Food in the Last Year: Never true   Transportation Needs: No Transportation Needs (02/10/2023)  [facility] - Transportation  Lack of Transportation (Medical): No  Lack of Transportation (Non-Medical): No  Physical Activity: Insufficiently Active (08/05/2017)  Exercise Vital Sign  Days of Exercise per Week: 3 days  Minutes of Exercise per Session: 40 min  Stress: No Stress Concern Present (08/05/2017)  [employer] of Occupational Health - Occupational Stress Questionnaire  Feeling of Stress : Not at all  Social Connections: Moderately Integrated (08/05/2017)  Social Connection and Isolation Panel [NHANES]  Frequency of Communication with Friends and Family: More than three times a week  Frequency of Social Gatherings with Friends and Family: More than three times a week  Attends Religious Services: More than 4 times per year  Active Member of [employer] or Organizations: Yes  Attends Banker Meetings: 1 to 4 times per year  Marital Status: Divorced   Objective:   Vitals:  BP: (!) 158/86  Pulse: 64  Temp: 36.4 C (97.5 F)  SpO2: 98%  Weight: 73.3 kg (161 lb 9.6 oz)  Height: 167.6 cm (5\' 6" )  PainSc: 0-No pain   Body mass index is 26.08 kg/m.  Physical Exam Vitals reviewed.  Constitutional:  General: She is not in acute distress. Appearance: Normal appearance.  HENT:  Head: Normocephalic and atraumatic.  Right Ear: External ear normal.  Left Ear: External ear normal.  Nose: Nose normal.  Mouth/Throat:  Mouth: Mucous membranes are moist.  Pharynx: Oropharynx is clear.  Eyes:  General: No scleral icterus. Extraocular Movements: Extraocular movements intact.  Conjunctiva/sclera: Conjunctivae normal.  Pupils: Pupils are equal, round, and reactive to light.  Cardiovascular:  Rate and Rhythm: Normal rate and regular rhythm.  Pulses: Normal pulses.  Heart sounds: Normal heart sounds.  Pulmonary:  Effort: Pulmonary effort is normal. No respiratory distress.  Breath  sounds: Normal breath sounds.  Abdominal:  General: Bowel sounds are normal.  Palpations: Abdomen is soft.  Tenderness: There is no abdominal tenderness.  Musculoskeletal:  General: No swelling, tenderness or deformity. Normal range of motion.  Cervical back: Normal range of motion and neck supple.  Skin: General: Skin is warm and dry.  Coloration: Skin is not jaundiced.  Neurological:  General: No focal deficit present.  Mental Status: She is alert and oriented to person, place, and time.  Psychiatric:  Mood and Affect: Mood normal.  Behavior: Behavior normal.     Breast: There is no palpable mass in either breast. There is no palpable axillary, supraclavicular, or cervical lymphadenopathy.  Labs, Imaging and Diagnostic Testing:  Assessment and Plan:   Diagnoses and all orders for this visit:  Ductal carcinoma in situ (DCIS) of right breast   The patient appears to have a 6.5 cm area of ductal carcinoma in situ involving most of the inner right breast. Because of the size of the area involved my recommendation would be for mastectomy. I have also talked to her about sentinel node mapping at the same time. She states that she would be interested in reconstruction. I have discussed with her in detail the risks and benefits of the operation as well as some of the  technical aspects including the risk of chronic pain and lymphedema and she understands. I will refer her to plastic surgery and then she will notify us when she is ready to schedule. I will also refer her to physical therapy to monitor her for lymphedema.

## 2023-04-19 ENCOUNTER — Encounter (HOSPITAL_BASED_OUTPATIENT_CLINIC_OR_DEPARTMENT_OTHER): Payer: Self-pay | Admitting: General Surgery

## 2023-04-19 DIAGNOSIS — D0511 Intraductal carcinoma in situ of right breast: Secondary | ICD-10-CM | POA: Diagnosis not present

## 2023-04-19 NOTE — Discharge Summary (Signed)
Physician Discharge Summary  Patient ID: Krystal Bennett MRN: [id number] DOB/AGE: [birth year] 66 y.o.  Admit date: 04/18/2023 Discharge date: 04/19/2023  Admission Diagnoses:  Discharge Diagnoses:  Principal Problem:   Breast cancer [facility])   Discharged Condition: good  Hospital Course: Patient is a 67 year old female who underwent right mastectomy with immediate breast reconstruction and placement of tissue expander and Flex HD with Dr. Carolynne Edouard and Dr. Ulice Bold yesterday 04/18/2023.  She stayed overnight for observation, she reports that she is doing well this a.m.  She did have some nausea last night which is now well-controlled.  She is accompanied by friend at bedside.  She does not have any specific questions at this time.  No acute overnight events.  Consults: None  Significant Diagnostic Studies: none  Treatments: IV hydration, antibiotics: Ancef, and analgesia: Dilaudid and oxycodone  Discharge Exam: Blood pressure (!) 163/77, pulse 72, temperature 98.2 F (36.8 C), resp. rate 16, height 5\' 6"  (1.676 m), weight 70.7 kg, SpO2 97%. General appearance: alert, cooperative, and no distress Head: Normocephalic, without obvious abnormality, atraumatic Resp: unlabored Breasts: Right breast s/p mastectomy, dressing in place, no subcutaneous swelling noted. JP drain with serosanguinous drainage noted. No erythema or cellulitic changes noted. Minimal tenderness noted upon palpation.   Disposition: Discharge disposition: 01-Home or Self Care       Discharge Instructions     Call MD for:  difficulty breathing, headache or visual disturbances   Complete by: As directed    Call MD for:  extreme fatigue   Complete by: As directed    Call MD for:  hives   Complete by: As directed    Call MD for:  persistant dizziness or light-headedness   Complete by: As directed    Call MD for:  persistant nausea and vomiting   Complete by: As directed    Call MD for:  redness, tenderness, or  signs of infection (pain, swelling, redness, odor or green/yellow discharge around incision site)   Complete by: As directed    Call MD for:  severe uncontrolled pain   Complete by: As directed    Call MD for:  temperature >100.4   Complete by: As directed    Diet - low sodium heart healthy   Complete by: As directed    Increase activity slowly   Complete by: As directed       Allergies as of 04/19/2023   No Known Allergies      Medication List     TAKE these medications    atorvastatin 20 MG tablet Commonly known as: LIPITOR Take 0.5 tablets by mouth daily at 6 PM.   b complex vitamins capsule Take 1 capsule by mouth daily.   Calcium High Potency/Vitamin D 600-5 MG-MCG Tabs Generic drug: Calcium Carb-Cholecalciferol Take by mouth.   COLLAGEN PO Take 1 each by mouth daily. Vital Proteins Collagen Peptides   diazepam 2 MG tablet Commonly known as: Valium Take 1 tablet (2 mg total) by mouth every 12 (twelve) hours as needed for muscle spasms.   Fish Oil 1000 MG Caps Take 2 capsules by mouth daily.   levothyroxine 88 MCG tablet Commonly known as: SYNTHROID Take 88 mcg by mouth daily.   losartan 100 MG tablet Commonly known as: COZAAR Take 100 mg by mouth daily.   MULTIVITAMIN WOMENS 50+ ADV PO Take 2 tablets by mouth daily.   NON FORMULARY Take 2 tablets by mouth daily. Propolis Complete Gut Health   NON FORMULARY Take by mouth daily.  Propolis, Royal Jelly, Bee Pollin   ondansetron 4 MG disintegrating tablet Commonly known as: ZOFRAN-ODT Take 1 tablet (4 mg total) by mouth every 8 (eight) hours as needed for nausea or vomiting.   Vitamin D-3 125 MCG (5000 UT) Tabs Take 125 mcg by mouth daily.        Follow-up Information     Dillingham, [name], DO Follow up in 10 day(s).   Specialty: Plastic Surgery Contact information: [address] [place] [address] [phone]                 [facility] [address] [place], [place] [address] [phone]  Signed: Kermit Balo  04/19/2023, 9:00 AM

## 2023-04-19 NOTE — Progress Notes (Signed)
1 Day Post-Op   Subjective/Chief Complaint: No complaints   Objective: Vital signs in last 24 hours: Temp:  [97.2 F (36.2 C)-98.2 F (36.8 C)] 98.2 F (36.8 C) (08/13 0400) Pulse Rate:  [61-71] 71 (08/13 0730) Resp:  [9-19] 16 (08/13 0400) BP: (96-169)/(59-147) 148/84 (08/13 0400) SpO2:  [91 %-100 %] 97 % (08/13 0730)    Intake/Output from previous day: 08/12 0701 - 08/13 0700 In: 3540 [P.O.:840; I.V.:2700] Out: 625 [Urine:400; Drains:160; Blood:65] Intake/Output this shift: No intake/output data recorded.  General appearance: alert and cooperative Resp: clear to auscultation bilaterally Chest wall: skin flaps look good Cardio: regular rate and rhythm GI: soft, non-tender; bowel sounds normal; no masses,  no organomegaly  Lab Results:  No results for input(s): "WBC", "HGB", "HCT", "PLT" in the last 72 hours. BMET No results for input(s): "NA", "K", "CL", "CO2", "GLUCOSE", "BUN", "CREATININE", "CALCIUM" in the last 72 hours. PT/INR No results for input(s): "LABPROT", "INR" in the last 72 hours. ABG No results for input(s): "PHART", "HCO3" in the last 72 hours.  Invalid input(s): "PCO2", "PO2"  Studies/Results: NM Sentinel Node Inj-No Rpt (Breast)  Result Date: 04/18/2023 Sulfur Colloid was injected by the Nuclear Medicine Technologist for sentinel lymph node localization.    Anti-infectives: Anti-infectives (From admission, onward)    Start     Dose/Rate Route Frequency Ordered Stop   04/18/23 1400  ceFAZolin (ANCEF) IVPB 2g/100 mL premix        2 g 200 mL/hr over 30 Minutes Intravenous Every 8 hours 04/18/23 1139 04/25/23 1359   04/18/23 0645  ceFAZolin (ANCEF) IVPB 2g/100 mL premix        2 g 200 mL/hr over 30 Minutes Intravenous On call to O.R. 04/18/23 9562 04/18/23 1518   04/18/23 0645  ceFAZolin (ANCEF) IVPB 2g/100 mL premix        2 g 200 mL/hr over 30 Minutes Intravenous On call to O.R. 04/18/23 0644 04/18/23 0717       Assessment/Plan: s/p  Procedure(s) with comments: RIGHT MASTECTOMY WITH SENTINEL LYMPH NODE BIOPSY (Right) - PEC BLOCK IMMEDIATE BREAST RECONSTRUCTION WITH PLACEMENT OF TISSUE EXPANDER AND FLEX HD (ACELLULAR HYDRATED DERMIS) (Right) Advance diet Discharge Teach pt drain care Disposition per Plastics  LOS: 0 days    Krystal Bennett 04/19/2023

## 2023-04-26 ENCOUNTER — Ambulatory Visit (INDEPENDENT_AMBULATORY_CARE_PROVIDER_SITE_OTHER): Payer: Medicare HMO | Admitting: Plastic Surgery

## 2023-04-26 ENCOUNTER — Encounter: Payer: Self-pay | Admitting: Plastic Surgery

## 2023-04-26 VITALS — BP 173/100 | HR 67 | Ht 66.0 in | Wt 155.8 lb

## 2023-04-26 DIAGNOSIS — Z9889 Other specified postprocedural states: Secondary | ICD-10-CM

## 2023-04-26 DIAGNOSIS — D0511 Intraductal carcinoma in situ of right breast: Secondary | ICD-10-CM

## 2023-04-26 NOTE — Progress Notes (Signed)
   Subjective:    Patient ID: Krystal Bennett, female    DOB: 03-30-1956, 67 y.o.   MRN: [id number]  The patient is a 67 year old female here for follow-up after undergoing surgery on August 12.  She had a right mastectomy with immediate breast reconstruction with expander and Flex HD placement.  He is 5 feet 6 inches tall and weighs 158 pounds.  The drain appears to be in place and working.  It is not ready for removal but next visit it should be.  She had 200 cc of saline placed in the expander at the time of the operation.  There is no sign of a hematoma or seroma.      Review of Systems  Constitutional: Negative.   Eyes: Negative.   Respiratory: Negative.    Cardiovascular: Negative.   Gastrointestinal: Negative.        Objective:   Physical Exam Cardiovascular:     Rate and Rhythm: Normal rate.     Pulses: Normal pulses.  Skin:    General: Skin is warm.     Capillary Refill: Capillary refill takes less than 2 seconds.  Neurological:     Mental Status: She is alert and oriented to person, place, and time.  Psychiatric:        Mood and Affect: Mood normal.        Behavior: Behavior normal.        Thought Content: Thought content normal.         Assessment & Plan:     ICD-10-CM   1. Ductal carcinoma in situ (DCIS) of right breast  D05.11       We placed injectable saline in the Expander using a sterile technique: Right: 50 cc for a total of 250 / 535 cc  Plan for drain removal next week.

## 2023-05-02 ENCOUNTER — Inpatient Hospital Stay: Payer: Medicare HMO | Attending: Internal Medicine | Admitting: Internal Medicine

## 2023-05-02 VITALS — BP 130/87 | HR 73 | Temp 97.9°F | Wt 157.0 lb

## 2023-05-02 DIAGNOSIS — I10 Essential (primary) hypertension: Secondary | ICD-10-CM | POA: Diagnosis not present

## 2023-05-02 DIAGNOSIS — E785 Hyperlipidemia, unspecified: Secondary | ICD-10-CM | POA: Insufficient documentation

## 2023-05-02 DIAGNOSIS — Z79811 Long term (current) use of aromatase inhibitors: Secondary | ICD-10-CM | POA: Diagnosis not present

## 2023-05-02 DIAGNOSIS — Z79899 Other long term (current) drug therapy: Secondary | ICD-10-CM | POA: Insufficient documentation

## 2023-05-02 DIAGNOSIS — E890 Postprocedural hypothyroidism: Secondary | ICD-10-CM | POA: Insufficient documentation

## 2023-05-02 DIAGNOSIS — Z9011 Acquired absence of right breast and nipple: Secondary | ICD-10-CM | POA: Diagnosis not present

## 2023-05-02 DIAGNOSIS — D0511 Intraductal carcinoma in situ of right breast: Secondary | ICD-10-CM | POA: Diagnosis present

## 2023-05-02 MED ORDER — ANASTROZOLE 1 MG PO TABS: 1.0000 mg | ORAL_TABLET | Freq: Every day | ORAL | 0 refills | Status: DC

## 2023-05-02 NOTE — Progress Notes (Signed)
Patient had her masectomy on 04/24/2023, she said it went well, she is still a little soar at the surgical sight.

## 2023-05-02 NOTE — Progress Notes (Signed)
[facility] CONSULT NOTE  Patient Care Team: Luciana Axe, NP as PCP - General (Family Medicine) Hulen Luster, RN as Oncology Nurse Navigator  CANCER STAGING   Cancer Staging  DCIS (ductal carcinoma in situ) Staging form: Breast, AJCC 8th Edition - Clinical stage from 02/03/2023: Stage 0 (cTis (DCIS), cN0, cM0, G3, ER: Unknown, PR: Unknown) - Signed by Michaelyn Barter, MD on 02/10/2023 Stage prefix: Initial diagnosis Nuclear grade: G3 Histologic grading system: 3 grade system   ASSESSMENT & PLAN:  Krystal Bennett 67 y.o. 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  # Right breast DCIS -Detected on screening mammogram.  A span of 6.5 cm suspicious calcifications present in the medial right breast.  2 areas were biopsied right posterior upper inner quadrant and right medial retroareolar.  For both pathology showing high-grade DCIS comedo type necrosis.  ER testing deferred to excisional specimen.  - s/p right simple mastectomy with immediate reconstruction By Dr. Carolynne Edouard and Dr. Ulice Bold on 04/18/2023.  Pathology showed DCIS, solid, nuclear grade 2-3 with necrosis, 2.5 x 1.1 x 1 cm, margins negative, closest 15 mm, 0/2 lymph node negative for malignancy.  ER 100% positive, PR 60% positive.  -Considering patient had mastectomy, there is no indication for adjuvant radiation.  - Pathology report was discussed with the patient.  DCIS is expressing estrogen and progesterone receptor and we discussed the data about aromatase inhibitor to decrease the risk of recurrence of DCIS and invasive carcinoma but no change in overall survival.  Patient was agreeable for endocrine therapy.  Will plan for anastrozole 1 mg once daily for 5 years.  Will  send a prescription.  Side effects such as mood changes, myalgia, arthralgia, osteoporosis, hot flashes, increased cholesterol level was all discussed.  Will obtain baseline bone density scan.  Continue with calcium vitamin D supplements.  Patient is still recovering from mastectomy.  She has drains in place.  I have advised her to start anastrozole 4 weeks from now.  I will follow-up with her in about 3 months to assess for toxicity.   -I discussed in detail about the diagnosis, prognosis and treatment with endocrine therapy.  The goal of the therapy is to the risk of recurrent DCIS and invasive breast cancer with ipsilateral and contralateral breast. Based on NSABP B-35 trial, 3100 patients were randomly assigned to treatment with tamoxifen or anastrozole for five years. At a median follow-up of nine years, when compared with tamoxifen, anastrozole resulted in: a lower incidence of subsequent breast cancer events (ie, recurrent DCIS or subsequent invasive breast cancer) (HR 0.73), including a lower rate of invasive breast cancer (HR 0.62) and Improved estimated breast cancer-free survival at 10 years. There is no difference in OS.   Side effects were discussed including but not limited to myalgias, arthralgias, mood changes, hot flashes, thinning of bone. Patient reports she had DEXA scan in past 2 years in [place]. Our coordinator will look in to it.   - Based on NSABP B24, discussed role of tamoxifen 20 mg daily x 5 years. At 10 years of follow-up, patients with ER-positive DCIS treated with tamoxifen had significant decreases in any subsequent (invasive and/or noninvasive; ipsilateral and/or contralateral) breast cancer events compared with patients receiving placebo (HR 0.58). Side effects such as mood changes, hot flashes, increased  risk of blood clot and small risk of endometrial cancer was discussed.   - TAM-01 data with role of low dose tamoxifen 5 mg daily x 3 years discussed. The TAM-01  population included 500 women (20% atypical ductal hyperplasia, 11% LCIS, and 69% DCIS). There was a significant 50% reduction of recurrence with tamoxifen in the DCIS cohort, which represents 70% of the overall population  The relative risk reduction with tamoxifen in this trial is similar to that in trials that used a higher dose of tamoxifen, but the rate of severe toxicity relative to placebo is less. However, a limitation of this trial was that treatment adherence was only about 60 percent in either arm. We discussed that efficacy data supporting lower-dose tamoxifen are limited to a single trial that did not compare it with standard dose treatment.   In the NSABP B-24 trial, adjuvant tamoxifen at 20 mg for 5 years after breast-conserving surgery plus whole breast irradiation for DCIS resulted in a 32% relative reduction in the risk of local recurrence (from 9% to 6.6%) and a 53% relative reduction in the risk of contralateral disease over 15 years. 170 Pre- and postmenopausal women were included. The benefit of tamoxifen was seen only in ER-positive DCIS and was not associated with improved OS. In the NSABP B-35 trial of postmenopausal women who received breast conservation therapy for ER-positive DCIS, adjuvant anastrozole was associated with a small improvement in breast cancer-free interval at 10 years versus tamoxifen (93.5% vs. 89.2%), with the benefit mainly limited to women younger than 60 years. Therefore, both adjuvant tamoxifen and anastrozole are appropriate options for postmenopausal women with DCIS, and considerations regarding toxicities should influence treatment choices for individual women. There is no role for endocrine therapy for risk reduction in women who undergo bilateral mastectomy. Women who have unilateral mastectomy for ER-positive DCIS may consider endocrine therapy for prevention of contralateral breast cancer  # Hyperlipidemia-on Lipitor # Hypertension-on losartan #  Hypothyroidism-on Synthroid  Orders Placed This Encounter  Procedures   DG Bone Density    Standing Status:   Future    Standing Expiration Date:   05/01/2024    Order Specific Question:   Reason for Exam (SYMPTOM  OR DIAGNOSIS REQUIRED)    Answer:   Baseline bone density on AI    Order Specific Question:   Preferred imaging location?    Answer:   [facility]   Ambulatory referral to Genetics    Standing Status:   Future    Standing Expiration Date:   05/01/2024    Referral Priority:   Routine    Referral Type:   Consultation    Referral Reason:   Specialty Services Required    Number of Visits Requested:   1   RTC in 4 months for MD visit, lab The total time spent in the appointment was 30 minutes encounter with patients including review of chart and various tests results, discussions about plan of care and coordination of care plan   All questions were answered. The patient knows to call the clinic with any problems, questions or concerns. No barriers to learning was detected.  Michaelyn Barter, MD 8/26/20243:45 PM   HISTORY OF PRESENTING ILLNESS:  Krystal Bennett 67 y.o. 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  Interval history Patient was seen today  as follow-up post right mastectomy with immediate reconstruction to discuss surgical pathology. She is slowly recovering from surgery.  Still has some soreness at the site.  Drains are in place.  I have reviewed her chart and materials related to her cancer extensively and collaborated history with the patient. Summary of oncologic history is as follows: Oncology History  DCIS (ductal carcinoma in situ)  12/23/2022 Mammogram   Screening mammogram FINDINGS: In the right breast,  calcifications warrant further evaluation with magnified views. In the left breast, no findings suspicious for malignancy.  Diagnostic mammogram IMPRESSION: Suspicious calcifications in the medial right breast spanning 6.5 cm.   02/03/2023 Cancer Staging   Staging form: Breast, AJCC 8th Edition - Clinical stage from 02/03/2023: Stage 0 (cTis (DCIS), cN0, cM0, G3, ER: Unknown, PR: Unknown) - Signed by Michaelyn Barter, MD on 02/10/2023 Stage prefix: Initial diagnosis Nuclear grade: G3 Histologic grading system: 3 grade system   02/03/2023 Initial Biopsy   DIAGNOSIS:  A. BREAST CALCIFICATIONS, RIGHT POSTERIOR UPPER INNER QUADRANT;  STEREOTACTIC BIOPSY:  - HIGH-GRADE DUCTAL CARCINOMA IN SITU (DCIS), COMEDO-TYPE WITH  CALCIFICATIONS.   B. BREAST CALCIFICATIONS, RIGHT MEDIAL RETROAREOLAR; STEREOTACTIC  BIOPSY:  - HIGH-GRADE DUCTAL CARCINOMA IN SITU (DCIS), COMEDO-TYPE WITH  CALCIFICATIONS.   Comment:  Testing for estrogen receptor is deferred to an excision specimen.    02/10/2023 Initial Diagnosis   DCIS (ductal carcinoma in situ)    Menarche-age 62 or 17 Children 2 son Age at first birth 69 Use of OCP more than 5 years Menopause age 37s HRT no History of breast biopsies normal Family history of prostate cancer in father and lung cancer in mother.  MEDICAL HISTORY:  Past Medical History:  Diagnosis Date   HTN (hypertension)    Hyperlipidemia     SURGICAL HISTORY: Past Surgical History:  Procedure Laterality Date   BREAST BIOPSY Right 02/03/2023   stereo calcs #1 posterior group, coil marker, path pending   BREAST BIOPSY Right 02/03/2023   stereo bx calcs #2 anterior group, x marker, path pending   BREAST BIOPSY Right 02/03/2023   MM RT BREAST BX W LOC DEV EA AD LESION IMG BX SPEC STEREO GUIDE 02/03/2023 [facility]   BREAST BIOPSY Right 02/03/2023   MM RT BREAST BX W LOC DEV 1ST LESION IMAGE BX SPEC STEREO GUIDE 02/03/2023 [facility]   BREAST RECONSTRUCTION  WITH PLACEMENT OF TISSUE EXPANDER AND FLEX HD (ACELLULAR HYDRATED DERMIS) Right 04/18/2023   Procedure: IMMEDIATE BREAST RECONSTRUCTION WITH PLACEMENT OF TISSUE EXPANDER AND FLEX HD (ACELLULAR HYDRATED DERMIS);  Surgeon: Peggye Form, DO;  Location: [facility];  Service: Plastics;  Laterality: Right;   CESAREAN SECTION     MASTECTOMY W/ SENTINEL NODE BIOPSY Right 04/18/2023   Procedure: RIGHT MASTECTOMY WITH SENTINEL LYMPH NODE BIOPSY;  Surgeon: Griselda Miner, MD;  Location: [facility];  Service: General;  Laterality: Right;  PEC BLOCK   THYROIDECTOMY N/A 10/04/2016   Procedure: THYROIDECTOMY;  Surgeon: Vernie Murders, MD;  Location: [facility];  Service: ENT;  Laterality: N/A;   WISDOM TOOTH EXTRACTION      SOCIAL HISTORY: Social History   Socioeconomic History   Marital status: Single    Spouse name: Not on file   Number of children: Not on file   Years of education: Not on file   Highest education level: Not on file  Occupational History   Not on file  Tobacco Use   Smoking status: Never    Passive exposure: Never  Smokeless tobacco: Never  Vaping Use   Vaping status: Never Used  Substance and Sexual Activity   Alcohol use: Yes    Comment: OCC WINE   Drug use: No   Sexual activity: Not on file  Other Topics Concern   Not on file  Social History Narrative   Not on file   Social Determinants of Health   Financial Resource Strain: Low Risk  (08/05/2017)   Received from [facility], [facility]   Overall Financial Resource Strain (CARDIA)    Difficulty of Paying Living Expenses: Not hard at all  Food Insecurity: No Food Insecurity (02/10/2023)   Hunger Vital Sign    Worried About Running Out of Food in the Last Year: Never true    Ran Out of Food in the Last Year: Never true  Transportation Needs: No Transportation Needs (02/10/2023)   PRAPARE - Administrator, Civil Service (Medical): No     Lack of Transportation (Non-Medical): No  Physical Activity: Insufficiently Active (08/05/2017)   Received from [facility], [facility]   Exercise Vital Sign    Days of Exercise per Week: 3 days    Minutes of Exercise per Session: 40 min  Stress: No Stress Concern Present (08/05/2017)   Received from [facility], [facility]   [employer] of Occupational Health - Occupational Stress Questionnaire    Feeling of Stress : Not at all  Social Connections: Moderately Integrated (08/05/2017)   Received from [facility], [facility]   Social Connection and Isolation Panel [NHANES]    Frequency of Communication with Friends and Family: More than three times a week    Frequency of Social Gatherings with Friends and Family: More than three times a week    Attends Religious Services: More than 4 times per year    Active Member of [employer] or Organizations: Yes    Attends Banker Meetings: 1 to 4 times per year    Marital Status: Divorced  Intimate Partner Violence: Not At Risk (02/10/2023)   Humiliation, Afraid, Rape, and Kick questionnaire    Fear of Current or Ex-Partner: No    Emotionally Abused: No    Physically Abused: No    Sexually Abused: No    FAMILY HISTORY: No family history on file.  ALLERGIES:  has No Known Allergies.  MEDICATIONS:  Current Outpatient Medications  Medication Sig Dispense Refill   anastrozole (ARIMIDEX) 1 MG tablet Take 1 tablet (1 mg total) by mouth daily. 90 tablet 0   atorvastatin (LIPITOR) 20 MG tablet Take 0.5 tablets by mouth daily at 6 PM.     b complex vitamins capsule Take 1 capsule by mouth daily.     Calcium Carb-Cholecalciferol (CALCIUM HIGH POTENCY/VITAMIN D) 600-5 MG-MCG TABS Take by mouth.     Cholecalciferol (VITAMIN D-3) 125 MCG (5000 UT) TABS Take 125 mcg by mouth daily.     COLLAGEN PO Take 1 each by mouth daily. Vital  Proteins Collagen Peptides     diazepam (VALIUM) 2 MG tablet Take 1 tablet (2 mg total) by mouth every 12 (twelve) hours as needed for muscle spasms. 20 tablet 0   levothyroxine (SYNTHROID) 88 MCG tablet Take 88 mcg by mouth daily.     losartan (COZAAR) 100 MG tablet Take 100 mg by mouth daily.     Multiple Vitamins-Minerals (MULTIVITAMIN WOMENS 50+ ADV PO) Take 2 tablets by  mouth daily.     NON FORMULARY Take 2 tablets by mouth daily. Propolis Complete Gut Health     NON FORMULARY Take by mouth daily. Propolis, Royal Jelly, Bee Pollin     Omega-3 Fatty Acids (FISH OIL) 1000 MG CAPS Take 2 capsules by mouth daily.     ondansetron (ZOFRAN-ODT) 4 MG disintegrating tablet Take 1 tablet (4 mg total) by mouth every 8 (eight) hours as needed for nausea or vomiting. 20 tablet 0   No current facility-administered medications for this visit.    REVIEW OF SYSTEMS:   Pertinent information mentioned in HPI All other systems were reviewed with the patient and are negative.  PHYSICAL EXAMINATION: ECOG PERFORMANCE STATUS: 0 - Asymptomatic  Vitals:   05/02/23 1046  BP: 130/87  Pulse: 73  Temp: 97.9 F (36.6 C)  SpO2: 100%   Filed Weights   05/02/23 1046  Weight: 157 lb (71.2 kg)    GENERAL:alert, no distress and comfortable SKIN: skin color, texture, turgor are normal, no rashes or significant lesions EYES: normal, conjunctiva are pink and non-injected, sclera clear OROPHARYNX:no exudate, no erythema and lips, buccal mucosa, and tongue normal  NECK: supple, thyroid normal size, non-tender, without nodularity LYMPH:  no palpable lymphadenopathy in the cervical, axillary or inguinal LUNGS: clear to auscultation and percussion with normal breathing effort HEART: regular rate & rhythm and no murmurs and no lower extremity edema ABDOMEN:abdomen soft, non-tender and normal bowel sounds Musculoskeletal:no cyanosis of digits and no clubbing  PSYCH: alert & oriented x 3 with fluent speech NEURO:  no focal motor/sensory deficits  LABORATORY DATA:  I have reviewed the data as listed No results found for: "WBC", "HGB", "HCT", "MCV", "PLT" No results for input(s): "NA", "K", "CL", "CO2", "GLUCOSE", "BUN", "CREATININE", "CALCIUM", "GFRNONAA", "GFRAA", "PROT", "ALBUMIN", "AST", "ALT", "ALKPHOS", "BILITOT", "BILIDIR", "IBILI" in the last 8760 hours.  RADIOGRAPHIC STUDIES: I have personally reviewed the radiological images as listed and agreed with the findings in the report. NM Sentinel Node Inj-No Rpt (Breast)  Result Date: 04/18/2023 Sulfur Colloid was injected by the Nuclear Medicine Technologist for sentinel lymph node localization.

## 2023-05-03 ENCOUNTER — Ambulatory Visit (INDEPENDENT_AMBULATORY_CARE_PROVIDER_SITE_OTHER): Payer: Medicare HMO | Admitting: Physician Assistant

## 2023-05-03 ENCOUNTER — Encounter: Payer: Self-pay | Admitting: *Deleted

## 2023-05-03 VITALS — BP 157/78 | HR 74

## 2023-05-03 DIAGNOSIS — Z9889 Other specified postprocedural states: Secondary | ICD-10-CM

## 2023-05-03 NOTE — Progress Notes (Signed)
Patient is a pleasant 67 year old female with PMH of right-sided mastectomy with immediate reconstruction using tissue expander and Flex HD performed 04/18/2023 by Dr. Ulice Bold and Dr. Carolynne Edouard who presents to clinic for postoperative follow-up.  She was seen most recently here in clinic on 04/26/2023.  At that time, exam was reassuring.  50 cc was placed in the expander for a total of 250/535 cc.  Discussed likelihood for drain removal at subsequent encounter.  Today, patient states that she is doing well.  She had no issues after last expander fill.  Volume output from her right breast expander drain has been between 50 to 80 cc for the past few days.  She denies any significant pain or discomfort.  She tolerated the last expander fill well.  She had been taking Tylenol, but has not required anything for the past 3 to 4 days.  She denies any chest pain, difficulty breathing, leg swelling, or fevers.  On exam, expanders appropriately placed.  No obvious subcutaneous fluid collections appreciated.  Ported Mepilex dressing removed without complication or difficulty.  Steri-Strips remain firmly intact.  No significant underlying drainage appreciated.  JP drain intact and functional, normal-appearing lightish serosanguineous output in bulb and tube.  Given high volume output, will leave drain in place until next week.  According to records, it has been downtrending since surgery.  Her exam is otherwise entirely benign.  We placed injectable saline in the Expander using a sterile technique: Right: 50 cc for a total of 300 / 535 cc  Return in 1 week for follow-up and consideration of drain removal.  Will consider another expander fill at that time.  Will defer photos at that time, as well.  She will call the clinic should she have questions or concerns in interim.

## 2023-05-11 ENCOUNTER — Encounter: Payer: Medicare HMO | Admitting: Physician Assistant

## 2023-05-12 NOTE — Progress Notes (Signed)
Patient is a pleasant 67 year old female with PMH of right-sided mastectomy with immediate reconstruction using tissue expander and Flex HD performed 04/18/2023 by Dr. Ulice Bold and Dr. Carolynne Edouard who presents to clinic for postoperative follow-up   She was last seen here in clinic on 05/03/2023.  At that time, volume output from right breast expander drain was between 50 to 80 cc.  Tolerated previous expander fill without difficulty.  Exam was benign.  Given high volume output, will leave drain in place for an additional week.  50 cc was placed into the expander for a total of 300/535 cc.  Return in 1 week.  Today, patient is doing well.  She tells me that her volume output has diminished recently, less than 35 cc/day x 5 consecutive days.  She feels.  For drain removal today.  She also feels.  For expander fill.  She had some minor discomfort and tightness after the recent expander fill, but well-controlled with Valium.  She is agreeable to another expander fill, but does admit that she does not want to be much larger.  She does not report any other concerns.  On exam, right side expander is well placed.  She does have some fullness laterally, but no obvious seroma or hematoma.  Steri-Strips removed and incision CDI.  Healing nicely.  Drain intact and functional, normal-appearing output in bulb and tube.  Drain removed without complication or difficulty.  We placed injectable saline in the Expander using a sterile technique: Right: 50 cc for a total of 350 / 535 cc  Picture(s) obtained of the patient and placed in the chart were with the patient's or guardian's permission.  Will have patient return in 8 days on the day that Dr. Ulice Bold is also in clinic.  In the interim, she will decide whether she wants an additional expander fill versus move forward with the plan exchange.  She will call the clinic should she have any questions or concerns in interim.

## 2023-05-16 ENCOUNTER — Ambulatory Visit (INDEPENDENT_AMBULATORY_CARE_PROVIDER_SITE_OTHER): Payer: Medicare HMO | Admitting: Physician Assistant

## 2023-05-16 VITALS — BP 148/84 | HR 72

## 2023-05-16 DIAGNOSIS — Z9889 Other specified postprocedural states: Secondary | ICD-10-CM

## 2023-05-17 ENCOUNTER — Inpatient Hospital Stay: Payer: Medicare HMO

## 2023-05-17 ENCOUNTER — Inpatient Hospital Stay: Payer: Medicare HMO | Attending: Internal Medicine | Admitting: Licensed Clinical Social Worker

## 2023-05-17 NOTE — Progress Notes (Deleted)
REFERRING PROVIDER: Michaelyn Barter, MD [address] [place],  [place] [address]  PRIMARY PROVIDER:  Luciana Axe, NP  PRIMARY REASON FOR VISIT:  1. Ductal carcinoma in situ (DCIS) of right breast   2. Family history of prostate cancer   3. Family history of lung cancer      HISTORY OF PRESENT ILLNESS:   Krystal Bennett, a 67 y.o. female, was seen for a [place] cancer genetics consultation at the request of Krystal Bennett due to a personal and family history of cancer.  Krystal Bennett presents to clinic today to discuss the possibility of a hereditary predisposition to cancer, genetic testing, and to further clarify her future cancer risks, as well as potential cancer risks for family members.   In 2024, at the age of 32, Krystal Bennett was diagnosed with DCIS of the right breast. The treatment plan included mastectomy, completed on 04/18/2023 and adjuvant endocrine therapy.   CANCER HISTORY:  Oncology History  DCIS (ductal carcinoma in situ)  12/23/2022 Mammogram   Screening mammogram FINDINGS: In the right breast, calcifications warrant further evaluation with magnified views. In the left breast, no findings suspicious for malignancy.  Diagnostic mammogram IMPRESSION: Suspicious calcifications in the medial right breast spanning 6.5 cm.   02/03/2023 Cancer Staging   Staging form: Breast, AJCC 8th Edition - Clinical stage from 02/03/2023: Stage 0 (cTis (DCIS), cN0, cM0, G3, ER: Unknown, PR: Unknown) - Signed by Krystal Barter, MD on 02/10/2023 Stage prefix: Initial diagnosis Nuclear grade: G3 Histologic grading system: 3 grade system   02/03/2023 Initial Biopsy   DIAGNOSIS:  A. BREAST CALCIFICATIONS, RIGHT POSTERIOR UPPER INNER QUADRANT;  STEREOTACTIC BIOPSY:  - HIGH-GRADE DUCTAL CARCINOMA IN SITU (DCIS), COMEDO-TYPE WITH  CALCIFICATIONS.   B. BREAST CALCIFICATIONS, RIGHT MEDIAL RETROAREOLAR; STEREOTACTIC  BIOPSY:  - HIGH-GRADE DUCTAL CARCINOMA IN SITU (DCIS), COMEDO-TYPE WITH   CALCIFICATIONS.   Comment:  Testing for estrogen receptor is deferred to an excision specimen.    02/10/2023 Initial Diagnosis   DCIS (ductal carcinoma in situ)     RISK FACTORS:  Menarche was at age 47.  First live birth at age 41.  OCP use for approximately  >5  years.  Ovaries intact: yes.  Hysterectomy: no.  Menopausal status: postmenopausal.  HRT use: 0 years. Colonoscopy: {Yes/No-Ex:120004}; {normal/abnormal/not examined:14677}.   Past Medical History:  Diagnosis Date   HTN (hypertension)    Hyperlipidemia     Past Surgical History:  Procedure Laterality Date   BREAST BIOPSY Right 02/03/2023   stereo calcs #1 posterior group, coil marker, path pending   BREAST BIOPSY Right 02/03/2023   stereo bx calcs #2 anterior group, x marker, path pending   BREAST BIOPSY Right 02/03/2023   MM RT BREAST BX W LOC DEV EA AD LESION IMG BX SPEC STEREO GUIDE 02/03/2023 [facility]   BREAST BIOPSY Right 02/03/2023   MM RT BREAST BX W LOC DEV 1ST LESION IMAGE BX SPEC STEREO GUIDE 02/03/2023 [facility]   BREAST RECONSTRUCTION WITH PLACEMENT OF TISSUE EXPANDER AND FLEX HD (ACELLULAR HYDRATED DERMIS) Right 04/18/2023   Procedure: IMMEDIATE BREAST RECONSTRUCTION WITH PLACEMENT OF TISSUE EXPANDER AND FLEX HD (ACELLULAR HYDRATED DERMIS);  Surgeon: Peggye Form, DO;  Location: [facility];  Service: Plastics;  Laterality: Right;   CESAREAN SECTION     MASTECTOMY W/ SENTINEL NODE BIOPSY Right 04/18/2023   Procedure: RIGHT MASTECTOMY WITH SENTINEL LYMPH NODE BIOPSY;  Surgeon: Griselda Miner, MD;  Location: [facility];  Service: General;  Laterality: Right;  PEC BLOCK   THYROIDECTOMY N/A 10/04/2016   Procedure: THYROIDECTOMY;  Surgeon: Vernie Murders, MD;  Location: [facility];  Service: ENT;  Laterality: N/A;   WISDOM TOOTH EXTRACTION      FAMILY HISTORY:  We obtained a detailed, 4-generation family history.  Significant diagnoses are listed below: No  family history on file.  Krystal Bennett is unaware of previous family history of genetic testing for hereditary cancer risks. There is no reported Ashkenazi Jewish ancestry. There is no known consanguinity.  GENETIC COUNSELING ASSESSMENT: Krystal Bennett is a 67 y.o. female with a personal history of DCIS and family history of cancer which is somewhat suggestive of a hereditary cancer syndrome and predisposition to cancer. We, therefore, discussed and recommended the following at today's visit.   DISCUSSION: We discussed that approximately 10% of breast cancer cancer is hereditary. Most cases of hereditary breast cancer are associated with BRCA1/BRCA2 genes, which also increase the risk for prostate cancer, although there are other genes associated with hereditary cancer as well. Cancers and risks are gene specific. We discussed that testing is beneficial for several reasons including knowing about cancer risks, identifying potential screening and risk-reduction options that may be appropriate, and to understand if other family members could be at risk for cancer and allow them to undergo genetic testing.   We reviewed the characteristics, features and inheritance patterns of hereditary cancer syndromes. We also discussed genetic testing, including the appropriate family members to test, the process of testing, insurance coverage and turn-around-time for results. We discussed the implications of a negative, positive and/or variant of uncertain significant result. We recommended Krystal Bennett pursue genetic testing for the [facility] Common Hereditary Cancers+RNA gene panel.   Based on Krystal Bennett's personal and family history of cancer, she meets medical criteria for genetic testing. Despite that she meets criteria, she may still have an out of pocket cost.   PLAN: After considering the risks, benefits, and limitations, Krystal Bennett provided informed consent to pursue genetic testing and the blood sample was sent to [facility] for analysis of the Common Hereditary Cancers+RNA panel. Results should be available within approximately 2-3 weeks' time, at which point they will be disclosed by telephone to Krystal Bennett, as will any additional recommendations warranted by these results. Ms. Desario will receive a summary of her genetic counseling visit and a copy of her results once available. This information will also be available in Epic.   *** Despite our recommendation, Ms. Ceesay did not wish to pursue genetic testing at today's visit. We understand this decision and remain available to coordinate genetic testing at any time in the future. We, therefore, recommend Ms. Friscia continue to follow the cancer screening guidelines given by her primary healthcare provider.  Ms. Cerro questions were answered to her satisfaction today. Our contact information was provided should additional questions or concerns arise. Thank you for the referral and allowing [place] to share in the care of your patient.   Lacy Duverney, MS, [facility] Genetic Counselor [email or web address] Phone: [phone]  The patient was seen for a total of *** minutes in face-to-face genetic counseling.  Dr. Blake Divine was available for discussion regarding this case.   _______________________________________________________________________ For Office Staff:  Number of people involved in session: *** Was an Intern/ student involved with case: no

## 2023-05-24 ENCOUNTER — Encounter: Payer: Self-pay | Admitting: Physician Assistant

## 2023-05-24 ENCOUNTER — Ambulatory Visit (INDEPENDENT_AMBULATORY_CARE_PROVIDER_SITE_OTHER): Payer: Medicare HMO | Admitting: Physician Assistant

## 2023-05-24 VITALS — BP 180/94 | HR 65

## 2023-05-24 DIAGNOSIS — Z9889 Other specified postprocedural states: Secondary | ICD-10-CM

## 2023-05-24 NOTE — Progress Notes (Cosign Needed Addendum)
Patient is a pleasant 67 year old female with PMH of right-sided mastectomy with immediate reconstruction using tissue expander and Flex HD performed 04/18/2023 by Dr. Ulice Bold and Dr. Carolynne Edouard who presents to clinic for postoperative follow-up.  She was last seen here in clinic on 05/16/2023.  At that time, exam was benign.  Some fullness noted laterally, but no obvious seroma or hematoma.  Steri-Strips removed and incision CDI.  Drain intact and functional, low volume output x 5 consecutive days.  Removed without complication or difficulty at bedside.  Expander fill performed for a total of 350/535 cc.  After conclusion of visit, patient felt satisfied with size.  Plan to return following week for reevaluation and consideration of repeat expander fill.  Today, patient is doing well.  No complaints.  She does feel as though she did benefit from additional fill prior to moving forward with implant exchange.  No changes in her health since last encounter.  On exam, right side expander is appropriately placed.  There was some bruising at the site of her last expander fill.  Otherwise, exam benign.  Incision remains CDI.  Skin not overly taut, remains amenable to expander fill today.  We placed injectable saline in the Expander using a sterile technique: Right: 50 cc for a total of 400 / 535 cc  Plan for patient to return in 1 week for follow-up.  At that time, can decide whether or not she is comfortable with current size or if she would like to consider an additional fill.  Suspect based on her previous conversations that she will not want to be much larger, but will reassess at next visit.  Discussed case with Dr. Ulice Bold and as long as she is comfortable, we can move forward.  Picture(s) obtained of the patient and placed in the chart were with the patient's or guardian's permission.  Patient's vital signs for today's encounter are as follows: BP (!) 180/94 (BP Location: Left Arm, Patient Position:  Sitting, Cuff Size: Normal)   Pulse 65   SpO2 97%   Patient denies any symptoms concerning for hypertensive emergency/endorgan damage such as dizziness, blurred vision, back pain, chest pain, or difficulty breathing.  However, emphasized the importance of close follow-up with primary care provider or urgent care provider for ongoing evaluation and management of their elevated blood pressures.  She tells me that her PCP manages her blood pressure and she is on at least 1 antihypertensive.  She took it earlier, but states that systolic 180 mmHg is not particular unusual for her.  She states that it goes up and down.  She states that she does see them regularly and that he is being actively managed.  Encouraged close follow-up.  Adamantly denies any cardiac symptoms.

## 2023-05-30 ENCOUNTER — Ambulatory Visit
Admission: RE | Admit: 2023-05-30 | Discharge: 2023-05-30 | Disposition: A | Discharge status: Home / Self Care | Payer: Medicare HMO | Source: Ambulatory Visit | Attending: Internal Medicine | Admitting: Internal Medicine

## 2023-05-30 DIAGNOSIS — Z780 Asymptomatic menopausal state: Secondary | ICD-10-CM | POA: Diagnosis not present

## 2023-05-30 DIAGNOSIS — D0511 Intraductal carcinoma in situ of right breast: Secondary | ICD-10-CM | POA: Diagnosis present

## 2023-05-30 DIAGNOSIS — Z13820 Encounter for screening for osteoporosis: Secondary | ICD-10-CM | POA: Insufficient documentation

## 2023-05-31 ENCOUNTER — Ambulatory Visit (INDEPENDENT_AMBULATORY_CARE_PROVIDER_SITE_OTHER): Payer: Medicare HMO | Admitting: Physician Assistant

## 2023-05-31 DIAGNOSIS — Z9889 Other specified postprocedural states: Secondary | ICD-10-CM

## 2023-05-31 NOTE — Progress Notes (Signed)
Patient is a pleasant 67 year old female with PMH of right-sided mastectomy with immediate reconstruction using tissue expander and Flex HD performed 04/18/2023 by Dr. Ulice Bold and Dr. Carolynne Edouard who presents to clinic for postoperative follow-up.   She was last seen here in clinic on 05/24/2023.  At that time, she did request an additional expander fill.  50 cc was placed into her right side expander for a total of 400/535 cc.  Exam was entirely benign.  Plan for return in 1 week for follow-up and decide whether or not she would be comfortable with current size or if she would like to proceed with additional fill.  Today, patient is doing well.  She states that she is comfortable with current size and does not want to be any larger.  She is ready proceed with implant exchange.  Discussed expectations and provided patient with [place] checklist to review prior to her soon-to-be scheduled preoperative encounter.  Exam is benign.  Mastectomy incision is healed nicely.  Expander appropriately placed.  Plan to go to the OR for implant change.  Will message surgical scheduler in addition to placing the order to help expedite the process.  Patient's blood pressure was elevated at 176/104 at today's encounter.  She is asymptomatic, denies any cardiac symptoms.  She states that she checks it at work and has been fluctuating between 115 systolic and 180 systolic.  She tells me that she takes losartan, encouraged her to follow-up with her PCP for evaluation and consideration of medication adjustment.  She understands the risks of poorly controlled hypertension.

## 2023-06-16 ENCOUNTER — Other Ambulatory Visit (INDEPENDENT_AMBULATORY_CARE_PROVIDER_SITE_OTHER): Payer: Medicare HMO | Admitting: Plastic Surgery

## 2023-06-16 DIAGNOSIS — Z9889 Other specified postprocedural states: Secondary | ICD-10-CM

## 2023-06-16 NOTE — Progress Notes (Signed)
 Implant order

## 2023-06-29 ENCOUNTER — Ambulatory Visit: Payer: Medicare HMO | Admitting: Physician Assistant

## 2023-06-29 ENCOUNTER — Encounter: Payer: Self-pay | Admitting: Physician Assistant

## 2023-06-29 VITALS — BP 178/83 | HR 62

## 2023-06-29 DIAGNOSIS — D0511 Intraductal carcinoma in situ of right breast: Secondary | ICD-10-CM | POA: Diagnosis not present

## 2023-06-29 DIAGNOSIS — Z9889 Other specified postprocedural states: Secondary | ICD-10-CM

## 2023-06-29 MED ORDER — CEPHALEXIN 500 MG PO CAPS: 500.0000 mg | ORAL_CAPSULE | Freq: Four times a day (QID) | ORAL | 0 refills | Status: AC

## 2023-06-29 MED ORDER — OXYCODONE HCL 5 MG PO TABS: 5.0000 mg | ORAL_TABLET | Freq: Four times a day (QID) | ORAL | 0 refills | Status: AC | PRN

## 2023-06-29 NOTE — Progress Notes (Addendum)
Patient ID: Krystal Bennett, female    DOB: Mar 19, 1956, 67 y.o.   MRN: [id number]  Chief Complaint  Patient presents with  . Pre-op Exam      ICD-10-CM   1. S/P breast reconstruction  Z98.890     2. Ductal carcinoma in situ (DCIS) of right breast  D05.11        History of Present Illness: Krystal Bennett is a 67 y.o.  female  with a history of right-sided breast cancer s/p mastectomy and reconstruction 04/2023.  She presents for preoperative evaluation for upcoming procedure, removal of right sided tissue expander and placement of breast implant and left-sided mastopexy/reduction for symmetry, scheduled for 07/27/2023 with Dr. Ulice Bold.  The patient has not had problems with anesthesia. Previous thyroidectomy without complication. She denies any personal or family history of blood clots or clotting disorder. She denies any varicosities. No nicotine use. Denies any significant cardiac disease aside from her high blood pressure and high cholesterol. She will hold her vitamins and supplements prior to surgery. Discussed the risks of breast reconstruction and patient expressed understanding and is agreeable.  Discussed patient's elevated blood pressure here today in clinic.  Patient tells me that her BP this morning at home was 130 systolic and that this is consistent with her blood pressures when coming to our office.  She continues to deny any cardiac symptoms.  Summary of Previous Visit: She was last seen here in clinic on 05/31/2023.  At that time, patient was comfortable with current expander volume and did not want any additional expander fill.  Volume equaled 400/535 cc.  Exam was benign, mastectomy tissue well-healed.  Discussed plan for exchange in the OR.  Job: Computer-based position, works from home.  PMH Significant for: Right breast DCIS, HTN, HLD, thyroid cancer s/p thyroidectomy.    Past Medical History: Allergies: No Known Allergies  Current Medications:  Current  Outpatient Medications:  .  anastrozole (ARIMIDEX) 1 MG tablet, Take 1 tablet (1 mg total) by mouth daily., Disp: 90 tablet, Rfl: 0 .  atorvastatin (LIPITOR) 20 MG tablet, Take 0.5 tablets by mouth daily at 6 PM., Disp: , Rfl:  .  b complex vitamins capsule, Take 1 capsule by mouth daily., Disp: , Rfl:  .  Calcium Carb-Cholecalciferol (CALCIUM HIGH POTENCY/VITAMIN D) 600-5 MG-MCG TABS, Take by mouth., Disp: , Rfl:  .  Cholecalciferol (VITAMIN D-3) 125 MCG (5000 UT) TABS, Take 125 mcg by mouth daily., Disp: , Rfl:  .  COLLAGEN PO, Take 1 each by mouth daily. Vital Proteins Collagen Peptides, Disp: , Rfl:  .  diazepam (VALIUM) 2 MG tablet, Take 1 tablet (2 mg total) by mouth every 12 (twelve) hours as needed for muscle spasms., Disp: 20 tablet, Rfl: 0 .  levothyroxine (SYNTHROID) 88 MCG tablet, Take 88 mcg by mouth daily., Disp: , Rfl:  .  losartan (COZAAR) 100 MG tablet, Take 100 mg by mouth daily., Disp: , Rfl:  .  Multiple Vitamins-Minerals (MULTIVITAMIN WOMENS 50+ ADV PO), Take 2 tablets by mouth daily., Disp: , Rfl:  .  NON FORMULARY, Take 2 tablets by mouth daily. Propolis Complete Gut Health, Disp: , Rfl:  .  NON FORMULARY, Take by mouth daily. Propolis, Royal Jelly, Bee Pollin, Disp: , Rfl:  .  Omega-3 Fatty Acids (FISH OIL) 1000 MG CAPS, Take 2 capsules by mouth daily., Disp: , Rfl:  .  ondansetron (ZOFRAN-ODT) 4 MG disintegrating tablet, Take 1 tablet (4 mg total) by mouth every 8 (  eight) hours as needed for nausea or vomiting., Disp: 20 tablet, Rfl: 0  Past Medical Problems: Past Medical History:  Diagnosis Date  . HTN (hypertension)   . Hyperlipidemia     Past Surgical History: Past Surgical History:  Procedure Laterality Date  . BREAST BIOPSY Right 02/03/2023   stereo calcs #1 posterior group, coil marker, path pending  . BREAST BIOPSY Right 02/03/2023   stereo bx calcs #2 anterior group, x marker, path pending  . BREAST BIOPSY Right 02/03/2023   MM RT BREAST BX W LOC DEV EA  AD LESION IMG BX SPEC STEREO GUIDE 02/03/2023 [facility]  . BREAST BIOPSY Right 02/03/2023   MM RT BREAST BX W LOC DEV 1ST LESION IMAGE BX SPEC STEREO GUIDE 02/03/2023 [facility]  . BREAST RECONSTRUCTION WITH PLACEMENT OF TISSUE EXPANDER AND FLEX HD (ACELLULAR HYDRATED DERMIS) Right 04/18/2023   Procedure: IMMEDIATE BREAST RECONSTRUCTION WITH PLACEMENT OF TISSUE EXPANDER AND FLEX HD (ACELLULAR HYDRATED DERMIS);  Surgeon: Peggye Form, DO;  Location: [facility];  Service: Plastics;  Laterality: Right;  . CESAREAN SECTION    . MASTECTOMY W/ SENTINEL NODE BIOPSY Right 04/18/2023   Procedure: RIGHT MASTECTOMY WITH SENTINEL LYMPH NODE BIOPSY;  Surgeon: Griselda Miner, MD;  Location: [facility];  Service: General;  Laterality: Right;  PEC BLOCK  . THYROIDECTOMY N/A 10/04/2016   Procedure: THYROIDECTOMY;  Surgeon: Vernie Murders, MD;  Location: [facility];  Service: ENT;  Laterality: N/A;  . WISDOM TOOTH EXTRACTION      Social History: Social History   Socioeconomic History  . Marital status: Single    Spouse name: Not on file  . Number of children: Not on file  . Years of education: Not on file  . Highest education level: Not on file  Occupational History  . Not on file  Tobacco Use  . Smoking status: Never    Passive exposure: Never  . Smokeless tobacco: Never  Vaping Use  . Vaping status: Never Used  Substance and Sexual Activity  . Alcohol use: Yes    Comment: OCC WINE  . Drug use: No  . Sexual activity: Not on file  Other Topics Concern  . Not on file  Social History Narrative  . Not on file   Social Determinants of Health   Financial Resource Strain: Low Risk  (08/05/2017)   Received from [facility], [facility]   Overall Financial Resource Strain (CARDIA)   . Difficulty of Paying Living Expenses: Not hard at all  Food Insecurity: No Food Insecurity (02/10/2023)   Hunger Vital Sign   . Worried  About Programme researcher, broadcasting/film/video in the Last Year: Never true   . Ran Out of Food in the Last Year: Never true  Transportation Needs: No Transportation Needs (02/10/2023)   PRAPARE - Transportation   . Lack of Transportation (Medical): No   . Lack of Transportation (Non-Medical): No  Physical Activity: Insufficiently Active (08/05/2017)   Received from [facility], [facility]   Exercise Vital Sign   . Days of Exercise per Week: 3 days   . Minutes of Exercise per Session: 40 min  Stress: No Stress Concern Present (08/05/2017)   Received from [facility], [facility]   [employer] of Occupational Health - Occupational Stress Questionnaire   . Feeling of Stress : Not at all  Social Connections: Moderately Integrated (08/05/2017)   Received from [facility], [facility]   Social Connection and Isolation Panel [NHANES]   . Frequency of Communication with Friends and Family: More than three times a week   . Frequency of Social Gatherings with Friends and Family: More than three times a week   . Attends Religious Services: More than 4 times per year   . Active Member of Clubs or Organizations: Yes   . Attends Banker Meetings: 1 to 4 times per year   . Marital Status: Divorced  Catering manager Violence: Not At Risk (02/10/2023)   Humiliation, Afraid, Rape, and Kick questionnaire   . Fear of Current or Ex-Partner: No   . Emotionally Abused: No   . Physically Abused: No   . Sexually Abused: No    Family History: No family history on file.  Review of Systems: ROS Denies any recent chest pain, difficulty breathing, leg swelling, fevers.  Physical Exam: Vital Signs There were no vitals taken for this visit.  Physical Exam Constitutional:      General: Not in acute distress.    Appearance: Normal appearance. Not ill-appearing.  HENT:     Head: Normocephalic and  atraumatic.  Eyes:     Pupils: Pupils are equal, round. Cardiovascular:     Rate and Rhythm: Normal rate.    Pulses: Normal pulses.  Pulmonary:     Effort: No respiratory distress or increased work of breathing.  Speaks in full sentences. Abdominal:     General: Abdomen is flat. No distension.   Musculoskeletal: Normal range of motion. No lower extremity swelling or edema. No varicosities. Skin:    General: Skin is warm and dry.     Findings: No erythema or rash.  Neurological:     Mental Status: Alert and oriented to person, place, and time.  Psychiatric:        Mood and Affect: Mood normal.        Behavior: Behavior normal.    Assessment/Plan: The patient is scheduled for right breast expander removal and implant placement and left breast mastopexy/reduction for symmetry with Dr. Ulice Bold.  Risks, benefits, and alternatives of procedure discussed, questions answered and consent obtained.    Smoking Status: Non-smoker. Last Mammogram: 12/2022; Results: BI-RADS Category 5 highly suggestive of right breast malignancy.  Since then biopsy-proven positive.   Caprini Score: 7; Risk Factors include: Age, BMI greater than 25, history of cancer, and length of planned surgery. Recommendation for mechanical and possibly pharmacological prophylaxis.  Will discuss with surgeon and prescribe Lovenox if indicated, otherwise encourage early ambulation.   Pictures obtained: 05/24/2023  Post-op Rx sent to pharmacy: Keflex and oxycodone. Ample odansetron at home.  Patient was provided with the General Surgical Risk consent document and Pain Medication Agreement prior to their appointment.  They had adequate time to read through the risk consent documents and Pain Medication Agreement. We also discussed them in person together during this preop appointment. All of their questions were answered to their satisfaction.  Recommended calling if they have any further questions.  Risk consent form and Pain  Medication Agreement to be scanned into patient's chart.  The risks that can be encountered with and after placement of a breast implant placement were discussed and include the following but not limited to these: bleeding, infection, delayed healing, anesthesia risks, skin sensation changes, injury to structures including nerves, blood vessels, and muscles which may be temporary or permanent, allergies to tape, suture materials and glues, blood products, topical preparations or injected agents,  skin contour irregularities, skin discoloration and swelling, deep vein thrombosis, cardiac and pulmonary complications, pain, which may persist, fluid accumulation, wrinkling of the skin over the implant, changes in nipple or breast sensation, implant leakage or rupture, faulty position of the implant, persistent pain, formation of tight scar tissue around the implant (capsular contracture), possible need for revisional surgery or staged procedures.    Electronically signed by: Evelena Leyden, PA-C 06/29/2023 12:56 PM

## 2023-06-29 NOTE — H&P (View-Only) (Signed)
 Patient ID: Krystal Bennett, female    DOB: Mar 19, 1956, 67 y.o.   MRN: [id number]  Chief Complaint  Patient presents with  . Pre-op Exam      ICD-10-CM   1. S/P breast reconstruction  Z98.890     2. Ductal carcinoma in situ (DCIS) of right breast  D05.11        History of Present Illness: Krystal Bennett is a 67 y.o.  female  with a history of right-sided breast cancer s/p mastectomy and reconstruction 04/2023.  She presents for preoperative evaluation for upcoming procedure, removal of right sided tissue expander and placement of breast implant and left-sided mastopexy/reduction for symmetry, scheduled for 07/27/2023 with Dr. Ulice Bold.  The patient has not had problems with anesthesia. Previous thyroidectomy without complication. She denies any personal or family history of blood clots or clotting disorder. She denies any varicosities. No nicotine use. Denies any significant cardiac disease aside from her high blood pressure and high cholesterol. She will hold her vitamins and supplements prior to surgery. Discussed the risks of breast reconstruction and patient expressed understanding and is agreeable.  Discussed patient's elevated blood pressure here today in clinic.  Patient tells me that her BP this morning at home was 130 systolic and that this is consistent with her blood pressures when coming to our office.  She continues to deny any cardiac symptoms.  Summary of Previous Visit: She was last seen here in clinic on 05/31/2023.  At that time, patient was comfortable with current expander volume and did not want any additional expander fill.  Volume equaled 400/535 cc.  Exam was benign, mastectomy tissue well-healed.  Discussed plan for exchange in the OR.  Job: Computer-based position, works from home.  PMH Significant for: Right breast DCIS, HTN, HLD, thyroid cancer s/p thyroidectomy.    Past Medical History: Allergies: No Known Allergies  Current Medications:  Current  Outpatient Medications:  .  anastrozole (ARIMIDEX) 1 MG tablet, Take 1 tablet (1 mg total) by mouth daily., Disp: 90 tablet, Rfl: 0 .  atorvastatin (LIPITOR) 20 MG tablet, Take 0.5 tablets by mouth daily at 6 PM., Disp: , Rfl:  .  b complex vitamins capsule, Take 1 capsule by mouth daily., Disp: , Rfl:  .  Calcium Carb-Cholecalciferol (CALCIUM HIGH POTENCY/VITAMIN D) 600-5 MG-MCG TABS, Take by mouth., Disp: , Rfl:  .  Cholecalciferol (VITAMIN D-3) 125 MCG (5000 UT) TABS, Take 125 mcg by mouth daily., Disp: , Rfl:  .  COLLAGEN PO, Take 1 each by mouth daily. Vital Proteins Collagen Peptides, Disp: , Rfl:  .  diazepam (VALIUM) 2 MG tablet, Take 1 tablet (2 mg total) by mouth every 12 (twelve) hours as needed for muscle spasms., Disp: 20 tablet, Rfl: 0 .  levothyroxine (SYNTHROID) 88 MCG tablet, Take 88 mcg by mouth daily., Disp: , Rfl:  .  losartan (COZAAR) 100 MG tablet, Take 100 mg by mouth daily., Disp: , Rfl:  .  Multiple Vitamins-Minerals (MULTIVITAMIN WOMENS 50+ ADV PO), Take 2 tablets by mouth daily., Disp: , Rfl:  .  NON FORMULARY, Take 2 tablets by mouth daily. Propolis Complete Gut Health, Disp: , Rfl:  .  NON FORMULARY, Take by mouth daily. Propolis, Royal Jelly, Bee Pollin, Disp: , Rfl:  .  Omega-3 Fatty Acids (FISH OIL) 1000 MG CAPS, Take 2 capsules by mouth daily., Disp: , Rfl:  .  ondansetron (ZOFRAN-ODT) 4 MG disintegrating tablet, Take 1 tablet (4 mg total) by mouth every 8 (  eight) hours as needed for nausea or vomiting., Disp: 20 tablet, Rfl: 0  Past Medical Problems: Past Medical History:  Diagnosis Date  . HTN (hypertension)   . Hyperlipidemia     Past Surgical History: Past Surgical History:  Procedure Laterality Date  . BREAST BIOPSY Right 02/03/2023   stereo calcs #1 posterior group, coil marker, path pending  . BREAST BIOPSY Right 02/03/2023   stereo bx calcs #2 anterior group, x marker, path pending  . BREAST BIOPSY Right 02/03/2023   MM RT BREAST BX W LOC DEV EA  AD LESION IMG BX SPEC STEREO GUIDE 02/03/2023 [facility]  . BREAST BIOPSY Right 02/03/2023   MM RT BREAST BX W LOC DEV 1ST LESION IMAGE BX SPEC STEREO GUIDE 02/03/2023 [facility]  . BREAST RECONSTRUCTION WITH PLACEMENT OF TISSUE EXPANDER AND FLEX HD (ACELLULAR HYDRATED DERMIS) Right 04/18/2023   Procedure: IMMEDIATE BREAST RECONSTRUCTION WITH PLACEMENT OF TISSUE EXPANDER AND FLEX HD (ACELLULAR HYDRATED DERMIS);  Surgeon: Peggye Form, DO;  Location: [facility];  Service: Plastics;  Laterality: Right;  . CESAREAN SECTION    . MASTECTOMY W/ SENTINEL NODE BIOPSY Right 04/18/2023   Procedure: RIGHT MASTECTOMY WITH SENTINEL LYMPH NODE BIOPSY;  Surgeon: Griselda Miner, MD;  Location: [facility];  Service: General;  Laterality: Right;  PEC BLOCK  . THYROIDECTOMY N/A 10/04/2016   Procedure: THYROIDECTOMY;  Surgeon: Vernie Murders, MD;  Location: [facility];  Service: ENT;  Laterality: N/A;  . WISDOM TOOTH EXTRACTION      Social History: Social History   Socioeconomic History  . Marital status: Single    Spouse name: Not on file  . Number of children: Not on file  . Years of education: Not on file  . Highest education level: Not on file  Occupational History  . Not on file  Tobacco Use  . Smoking status: Never    Passive exposure: Never  . Smokeless tobacco: Never  Vaping Use  . Vaping status: Never Used  Substance and Sexual Activity  . Alcohol use: Yes    Comment: OCC WINE  . Drug use: No  . Sexual activity: Not on file  Other Topics Concern  . Not on file  Social History Narrative  . Not on file   Social Determinants of Health   Financial Resource Strain: Low Risk  (08/05/2017)   Received from [facility], [facility]   Overall Financial Resource Strain (CARDIA)   . Difficulty of Paying Living Expenses: Not hard at all  Food Insecurity: No Food Insecurity (02/10/2023)   Hunger Vital Sign   . Worried  About Programme researcher, broadcasting/film/video in the Last Year: Never true   . Ran Out of Food in the Last Year: Never true  Transportation Needs: No Transportation Needs (02/10/2023)   PRAPARE - Transportation   . Lack of Transportation (Medical): No   . Lack of Transportation (Non-Medical): No  Physical Activity: Insufficiently Active (08/05/2017)   Received from [facility], [facility]   Exercise Vital Sign   . Days of Exercise per Week: 3 days   . Minutes of Exercise per Session: 40 min  Stress: No Stress Concern Present (08/05/2017)   Received from [facility], [facility]   [employer] of Occupational Health - Occupational Stress Questionnaire   . Feeling of Stress : Not at all  Social Connections: Moderately Integrated (08/05/2017)   Received from [facility], [facility]   Social Connection and Isolation Panel [NHANES]   . Frequency of Communication with Friends and Family: More than three times a week   . Frequency of Social Gatherings with Friends and Family: More than three times a week   . Attends Religious Services: More than 4 times per year   . Active Member of Clubs or Organizations: Yes   . Attends Banker Meetings: 1 to 4 times per year   . Marital Status: Divorced  Catering manager Violence: Not At Risk (02/10/2023)   Humiliation, Afraid, Rape, and Kick questionnaire   . Fear of Current or Ex-Partner: No   . Emotionally Abused: No   . Physically Abused: No   . Sexually Abused: No    Family History: No family history on file.  Review of Systems: ROS Denies any recent chest pain, difficulty breathing, leg swelling, fevers.  Physical Exam: Vital Signs There were no vitals taken for this visit.  Physical Exam Constitutional:      General: Not in acute distress.    Appearance: Normal appearance. Not ill-appearing.  HENT:     Head: Normocephalic and  atraumatic.  Eyes:     Pupils: Pupils are equal, round. Cardiovascular:     Rate and Rhythm: Normal rate.    Pulses: Normal pulses.  Pulmonary:     Effort: No respiratory distress or increased work of breathing.  Speaks in full sentences. Abdominal:     General: Abdomen is flat. No distension.   Musculoskeletal: Normal range of motion. No lower extremity swelling or edema. No varicosities. Skin:    General: Skin is warm and dry.     Findings: No erythema or rash.  Neurological:     Mental Status: Alert and oriented to person, place, and time.  Psychiatric:        Mood and Affect: Mood normal.        Behavior: Behavior normal.    Assessment/Plan: The patient is scheduled for right breast expander removal and implant placement and left breast mastopexy/reduction for symmetry with Dr. Ulice Bold.  Risks, benefits, and alternatives of procedure discussed, questions answered and consent obtained.    Smoking Status: Non-smoker. Last Mammogram: 12/2022; Results: BI-RADS Category 5 highly suggestive of right breast malignancy.  Since then biopsy-proven positive.   Caprini Score: 7; Risk Factors include: Age, BMI greater than 25, history of cancer, and length of planned surgery. Recommendation for mechanical and possibly pharmacological prophylaxis.  Will discuss with surgeon and prescribe Lovenox if indicated, otherwise encourage early ambulation.   Pictures obtained: 05/24/2023  Post-op Rx sent to pharmacy: Keflex and oxycodone. Ample odansetron at home.  Patient was provided with the General Surgical Risk consent document and Pain Medication Agreement prior to their appointment.  They had adequate time to read through the risk consent documents and Pain Medication Agreement. We also discussed them in person together during this preop appointment. All of their questions were answered to their satisfaction.  Recommended calling if they have any further questions.  Risk consent form and Pain  Medication Agreement to be scanned into patient's chart.  The risks that can be encountered with and after placement of a breast implant placement were discussed and include the following but not limited to these: bleeding, infection, delayed healing, anesthesia risks, skin sensation changes, injury to structures including nerves, blood vessels, and muscles which may be temporary or permanent, allergies to tape, suture materials and glues, blood products, topical preparations or injected agents,  skin contour irregularities, skin discoloration and swelling, deep vein thrombosis, cardiac and pulmonary complications, pain, which may persist, fluid accumulation, wrinkling of the skin over the implant, changes in nipple or breast sensation, implant leakage or rupture, faulty position of the implant, persistent pain, formation of tight scar tissue around the implant (capsular contracture), possible need for revisional surgery or staged procedures.    Electronically signed by: Evelena Leyden, PA-C 06/29/2023 12:56 PM

## 2023-06-30 NOTE — Plan of Care (Signed)
[facility] Tonsillectomy/Adenoidectomy, Postoperative PEDS care plan entered in error.

## 2023-07-20 ENCOUNTER — Encounter (HOSPITAL_BASED_OUTPATIENT_CLINIC_OR_DEPARTMENT_OTHER): Payer: Self-pay | Admitting: Plastic Surgery

## 2023-07-20 ENCOUNTER — Other Ambulatory Visit: Payer: Self-pay

## 2023-07-26 NOTE — Anesthesia Preprocedure Evaluation (Signed)
Anesthesia Evaluation  Patient identified by MRN, date of birth, ID band Patient awake    Reviewed: Allergy & Precautions, NPO status , Patient's Chart, lab work & pertinent test results  Airway Mallampati: II  TM Distance: >3 FB Neck ROM: Full    Dental  (+) Dental Advisory Given, Teeth Intact   Pulmonary neg pulmonary ROS   Pulmonary exam normal breath sounds clear to auscultation       Cardiovascular hypertension, Pt. on medications Normal cardiovascular exam Rhythm:Regular Rate:Normal     Neuro/Psych negative neurological ROS  negative psych ROS   GI/Hepatic negative GI ROS, Neg liver ROS,neg GERD  ,,  Endo/Other  Hypothyroidism  Right breast Ca HLD  Renal/GU negative Renal ROS     Musculoskeletal negative musculoskeletal ROS (+)    Abdominal   Peds  Hematology negative hematology ROS (+)   Anesthesia Other Findings   Reproductive/Obstetrics                             Anesthesia Physical Anesthesia Plan  ASA: 2  Anesthesia Plan: General   Post-op Pain Management: Tylenol PO (pre-op)* and Gabapentin PO (pre-op)*   Induction: Intravenous  PONV Risk Score and Plan: 4 or greater and Treatment may vary due to age or medical condition, Ondansetron, Dexamethasone and Midazolam  Airway Management Planned: LMA and Oral ETT  Additional Equipment: None  Intra-op Plan:   Post-operative Plan: Extubation in OR  Informed Consent: I have reviewed the patients History and Physical, chart, labs and discussed the procedure including the risks, benefits and alternatives for the proposed anesthesia with the patient or authorized representative who has indicated his/her understanding and acceptance.     Dental advisory given  Plan Discussed with: CRNA  Anesthesia Plan Comments:         Anesthesia Quick Evaluation

## 2023-07-27 ENCOUNTER — Encounter (HOSPITAL_BASED_OUTPATIENT_CLINIC_OR_DEPARTMENT_OTHER)
Admission: RE | Disposition: A | Discharge status: Home / Self Care | Payer: Self-pay | Source: Home / Self Care | Attending: Plastic Surgery

## 2023-07-27 ENCOUNTER — Ambulatory Visit (HOSPITAL_BASED_OUTPATIENT_CLINIC_OR_DEPARTMENT_OTHER): Payer: Self-pay | Admitting: Anesthesiology

## 2023-07-27 ENCOUNTER — Other Ambulatory Visit: Payer: Self-pay

## 2023-07-27 ENCOUNTER — Ambulatory Visit (HOSPITAL_BASED_OUTPATIENT_CLINIC_OR_DEPARTMENT_OTHER)
Admission: RE | Admit: 2023-07-27 | Discharge: 2023-07-27 | Disposition: A | Discharge status: Home / Self Care | Payer: Medicare HMO | Attending: Plastic Surgery | Admitting: Plastic Surgery

## 2023-07-27 ENCOUNTER — Ambulatory Visit (HOSPITAL_BASED_OUTPATIENT_CLINIC_OR_DEPARTMENT_OTHER): Payer: Medicare HMO | Admitting: Anesthesiology

## 2023-07-27 ENCOUNTER — Encounter (HOSPITAL_BASED_OUTPATIENT_CLINIC_OR_DEPARTMENT_OTHER): Payer: Self-pay | Admitting: Plastic Surgery

## 2023-07-27 DIAGNOSIS — Z9011 Acquired absence of right breast and nipple: Secondary | ICD-10-CM | POA: Diagnosis not present

## 2023-07-27 DIAGNOSIS — E785 Hyperlipidemia, unspecified: Secondary | ICD-10-CM | POA: Insufficient documentation

## 2023-07-27 DIAGNOSIS — I10 Essential (primary) hypertension: Secondary | ICD-10-CM | POA: Diagnosis not present

## 2023-07-27 DIAGNOSIS — N651 Disproportion of reconstructed breast: Secondary | ICD-10-CM | POA: Diagnosis not present

## 2023-07-27 DIAGNOSIS — E890 Postprocedural hypothyroidism: Secondary | ICD-10-CM | POA: Diagnosis not present

## 2023-07-27 DIAGNOSIS — Z853 Personal history of malignant neoplasm of breast: Secondary | ICD-10-CM | POA: Diagnosis not present

## 2023-07-27 DIAGNOSIS — Z01818 Encounter for other preprocedural examination: Secondary | ICD-10-CM

## 2023-07-27 DIAGNOSIS — Z421 Encounter for breast reconstruction following mastectomy: Secondary | ICD-10-CM | POA: Diagnosis present

## 2023-07-27 HISTORY — PX: BREAST REDUCTION WITH MASTOPEXY: SHX6465

## 2023-07-27 HISTORY — PX: REMOVAL OF TISSUE EXPANDER AND PLACEMENT OF IMPLANT: SHX6457

## 2023-07-27 SURGERY — REMOVAL, TISSUE EXPANDER, BREAST, WITH IMPLANT INSERTION
Anesthesia: General | Site: Chest | Laterality: Right

## 2023-07-27 MED ORDER — PHENYLEPHRINE 80 MCG/ML (10ML) SYRINGE FOR IV PUSH (FOR BLOOD PRESSURE SUPPORT)
PREFILLED_SYRINGE | INTRAVENOUS | Status: AC
  Filled 2023-07-27: qty 10

## 2023-07-27 MED ORDER — CEFAZOLIN SODIUM-DEXTROSE 2-4 GM/100ML-% IV SOLN
2.0000 g | INTRAVENOUS | Status: AC
Start: 2023-07-27 — End: 2023-07-27
  Administered 2023-07-27: 2 g via INTRAVENOUS

## 2023-07-27 MED ORDER — OXYCODONE HCL 5 MG PO TABS: 5.0000 mg | ORAL_TABLET | Freq: Once | ORAL | Status: DC | PRN

## 2023-07-27 MED ORDER — HYDROMORPHONE HCL 1 MG/ML IJ SOLN: 0.2500 mg | INTRAMUSCULAR | Status: DC | PRN

## 2023-07-27 MED ORDER — GABAPENTIN 300 MG PO CAPS
300.0000 mg | ORAL_CAPSULE | Freq: Once | ORAL | Status: AC
  Administered 2023-07-27: 300 mg via ORAL

## 2023-07-27 MED ORDER — CHLORHEXIDINE GLUCONATE CLOTH 2 % EX PADS: 6.0000 | MEDICATED_PAD | Freq: Once | CUTANEOUS | Status: DC

## 2023-07-27 MED ORDER — ONDANSETRON HCL 4 MG/2ML IJ SOLN
INTRAMUSCULAR | Status: AC
  Filled 2023-07-27: qty 2

## 2023-07-27 MED ORDER — ACETAMINOPHEN 500 MG PO TABS
1000.0000 mg | ORAL_TABLET | Freq: Once | ORAL | Status: AC
  Administered 2023-07-27: 1000 mg via ORAL

## 2023-07-27 MED ORDER — FENTANYL CITRATE (PF) 100 MCG/2ML IJ SOLN
INTRAMUSCULAR | Status: AC
  Filled 2023-07-27: qty 2

## 2023-07-27 MED ORDER — GABAPENTIN 300 MG PO CAPS
ORAL_CAPSULE | ORAL | Status: AC
  Filled 2023-07-27: qty 1

## 2023-07-27 MED ORDER — ACETAMINOPHEN 500 MG PO TABS
ORAL_TABLET | ORAL | Status: AC
  Filled 2023-07-27: qty 2

## 2023-07-27 MED ORDER — SODIUM CHLORIDE 0.9 % IV SOLN: 250.0000 mL | INTRAVENOUS | Status: DC | PRN

## 2023-07-27 MED ORDER — DEXAMETHASONE SODIUM PHOSPHATE 4 MG/ML IJ SOLN
INTRAMUSCULAR | Status: DC | PRN
  Administered 2023-07-27: 5 mg via INTRAVENOUS

## 2023-07-27 MED ORDER — ACETAMINOPHEN 325 MG PO TABS: 650.0000 mg | ORAL_TABLET | ORAL | Status: DC | PRN

## 2023-07-27 MED ORDER — OXYCODONE HCL 5 MG PO TABS: 5.0000 mg | ORAL_TABLET | ORAL | Status: DC | PRN

## 2023-07-27 MED ORDER — EPHEDRINE 5 MG/ML INJ
INTRAVENOUS | Status: AC
  Filled 2023-07-27: qty 5

## 2023-07-27 MED ORDER — SUCCINYLCHOLINE CHLORIDE 200 MG/10ML IV SOSY
PREFILLED_SYRINGE | INTRAVENOUS | Status: AC
  Filled 2023-07-27: qty 10

## 2023-07-27 MED ORDER — MIDAZOLAM HCL 5 MG/5ML IJ SOLN
INTRAMUSCULAR | Status: DC | PRN
  Administered 2023-07-27: 2 mg via INTRAVENOUS

## 2023-07-27 MED ORDER — SODIUM CHLORIDE 0.9% FLUSH: 3.0000 mL | INTRAVENOUS | Status: DC | PRN

## 2023-07-27 MED ORDER — DEXAMETHASONE SODIUM PHOSPHATE 10 MG/ML IJ SOLN
INTRAMUSCULAR | Status: AC
  Filled 2023-07-27: qty 1

## 2023-07-27 MED ORDER — SODIUM CHLORIDE 0.9% FLUSH: 3.0000 mL | Freq: Two times a day (BID) | INTRAVENOUS | Status: DC

## 2023-07-27 MED ORDER — ONDANSETRON HCL 4 MG/2ML IJ SOLN
INTRAMUSCULAR | Status: DC | PRN
  Administered 2023-07-27: 4 mg via INTRAVENOUS

## 2023-07-27 MED ORDER — FENTANYL CITRATE (PF) 100 MCG/2ML IJ SOLN: 25.0000 ug | INTRAMUSCULAR | Status: DC | PRN

## 2023-07-27 MED ORDER — CEFAZOLIN SODIUM-DEXTROSE 2-4 GM/100ML-% IV SOLN
INTRAVENOUS | Status: AC
Start: 2023-07-27 — End: ?
  Filled 2023-07-27: qty 100

## 2023-07-27 MED ORDER — LACTATED RINGERS IV SOLN: INTRAVENOUS | Status: DC

## 2023-07-27 MED ORDER — DROPERIDOL 2.5 MG/ML IJ SOLN: 0.6250 mg | Freq: Once | INTRAMUSCULAR | Status: DC | PRN

## 2023-07-27 MED ORDER — LIDOCAINE 2% (20 MG/ML) 5 ML SYRINGE
INTRAMUSCULAR | Status: DC | PRN
  Administered 2023-07-27: 60 mg via INTRAVENOUS

## 2023-07-27 MED ORDER — MIDAZOLAM HCL 2 MG/2ML IJ SOLN
INTRAMUSCULAR | Status: AC
  Filled 2023-07-27: qty 2

## 2023-07-27 MED ORDER — PHENYLEPHRINE HCL (PRESSORS) 10 MG/ML IV SOLN
INTRAVENOUS | Status: DC | PRN
  Administered 2023-07-27: 160 ug via INTRAVENOUS

## 2023-07-27 MED ORDER — DROPERIDOL 2.5 MG/ML IJ SOLN
INTRAMUSCULAR | Status: DC | PRN
  Administered 2023-07-27: .625 mg via INTRAVENOUS

## 2023-07-27 MED ORDER — PROPOFOL 10 MG/ML IV BOLUS
INTRAVENOUS | Status: DC | PRN
  Administered 2023-07-27: 120 mg via INTRAVENOUS

## 2023-07-27 MED ORDER — FENTANYL CITRATE (PF) 100 MCG/2ML IJ SOLN
INTRAMUSCULAR | Status: DC | PRN
  Administered 2023-07-27 (×2): 50 ug via INTRAVENOUS
  Administered 2023-07-27: 25 ug via INTRAVENOUS

## 2023-07-27 MED ORDER — OXYCODONE HCL 5 MG/5ML PO SOLN
5.0000 mg | Freq: Once | ORAL | Status: DC | PRN
Start: 2023-07-27 — End: 2023-07-27

## 2023-07-27 MED ORDER — ATROPINE SULFATE 0.4 MG/ML IV SOLN
INTRAVENOUS | Status: AC
  Filled 2023-07-27: qty 1

## 2023-07-27 MED ORDER — LIDOCAINE 2% (20 MG/ML) 5 ML SYRINGE
INTRAMUSCULAR | Status: AC
  Filled 2023-07-27: qty 5

## 2023-07-27 MED ORDER — VASHE WOUND IRRIGATION OPTIME
TOPICAL | Status: DC | PRN
  Administered 2023-07-27: 34 [oz_av]

## 2023-07-27 MED ORDER — LIDOCAINE-EPINEPHRINE 1 %-1:100000 IJ SOLN
INTRAMUSCULAR | Status: DC | PRN
  Administered 2023-07-27: 9 mL

## 2023-07-27 MED ORDER — ACETAMINOPHEN 325 MG RE SUPP: 650.0000 mg | RECTAL | Status: DC | PRN

## 2023-07-27 SURGICAL SUPPLY — 72 items
BAG DECANTER FOR FLEXI CONT (MISCELLANEOUS) ×2 IMPLANT
BINDER BREAST LRG (GAUZE/BANDAGES/DRESSINGS) IMPLANT
BINDER BREAST MEDIUM (GAUZE/BANDAGES/DRESSINGS) IMPLANT
BINDER BREAST XLRG (GAUZE/BANDAGES/DRESSINGS) IMPLANT
BINDER BREAST XXLRG (GAUZE/BANDAGES/DRESSINGS) IMPLANT
BIOPATCH RED 1 DISK 7.0 (GAUZE/BANDAGES/DRESSINGS) IMPLANT
BLADE HEX COATED 2.75 (ELECTRODE) ×2 IMPLANT
BLADE SURG 10 STRL SS (BLADE) ×2 IMPLANT
BLADE SURG 15 STRL LF DISP TIS (BLADE) ×2 IMPLANT
BNDG GAUZE DERMACEA FLUFF 4 (GAUZE/BANDAGES/DRESSINGS) IMPLANT
CANISTER SUCT 1200ML W/VALVE (MISCELLANEOUS) ×2 IMPLANT
COVER BACK TABLE 60X90IN (DRAPES) ×2 IMPLANT
COVER MAYO STAND STRL (DRAPES) ×2 IMPLANT
DERMABOND ADVANCED .7 DNX12 (GAUZE/BANDAGES/DRESSINGS) IMPLANT
DRAIN CHANNEL 19F RND (DRAIN) IMPLANT
DRAPE LAPAROSCOPIC ABDOMINAL (DRAPES) ×2 IMPLANT
DRAPE UTILITY XL STRL (DRAPES) IMPLANT
DRESSING MEPILEX FLEX 4X4 (GAUZE/BANDAGES/DRESSINGS) IMPLANT
DRSG MEPILEX FLEX 4X4 (GAUZE/BANDAGES/DRESSINGS) ×4
DRSG MEPILEX POST OP 4X8 (GAUZE/BANDAGES/DRESSINGS) ×4 IMPLANT
ELECT BLADE 4.0 EZ CLEAN MEGAD (MISCELLANEOUS) ×2
ELECT COATED BLADE 2.86 ST (ELECTRODE) ×2 IMPLANT
ELECT REM PT RETURN 9FT ADLT (ELECTROSURGICAL) ×2
ELECTRODE BLDE 4.0 EZ CLN MEGD (MISCELLANEOUS) ×2 IMPLANT
ELECTRODE REM PT RTRN 9FT ADLT (ELECTROSURGICAL) ×2 IMPLANT
EVACUATOR SILICONE 100CC (DRAIN) IMPLANT
FUNNEL KELLER 2 DISP (MISCELLANEOUS) IMPLANT
GAUZE PAD ABD 8X10 STRL (GAUZE/BANDAGES/DRESSINGS) ×4 IMPLANT
GAUZE SPONGE 4X4 12PLY STRL (GAUZE/BANDAGES/DRESSINGS) IMPLANT
GAUZE SPONGE 4X4 12PLY STRL LF (GAUZE/BANDAGES/DRESSINGS) IMPLANT
GLOVE BIO SURGEON STRL SZ 6.5 (GLOVE) ×4 IMPLANT
GLOVE BIO SURGEON STRL SZ7.5 (GLOVE) ×2 IMPLANT
GLOVE BIOGEL PI IND STRL 7.0 (GLOVE) IMPLANT
GLOVE BIOGEL PI IND STRL 8 (GLOVE) IMPLANT
GOWN STRL REUS W/ TWL LRG LVL3 (GOWN DISPOSABLE) ×6 IMPLANT
IMPL SILICONE 480CC (Breast) IMPLANT
IMPLANT SILICONE 480CC (Breast) ×2 IMPLANT
IV NS 1000ML BAXH (IV SOLUTION) IMPLANT
NDL HYPO 25X1 1.5 SAFETY (NEEDLE) ×2 IMPLANT
NDL SAFETY ECLIPSE 18X1.5 (NEEDLE) ×2 IMPLANT
NEEDLE HYPO 25X1 1.5 SAFETY (NEEDLE) ×2
NS IRRIG 1000ML POUR BTL (IV SOLUTION) IMPLANT
PACK BASIN DAY SURGERY FS (CUSTOM PROCEDURE TRAY) ×2 IMPLANT
PENCIL SMOKE EVACUATOR (MISCELLANEOUS) ×2 IMPLANT
PIN SAFETY STERILE (MISCELLANEOUS) IMPLANT
SIZER BREAST GEL REUSE 480CC (SIZER) ×2
SIZER BRST GEL REUSE 480CC (SIZER) IMPLANT
SIZER BRST REUSE ULT HI 535CC (SIZER) IMPLANT
SLEEVE SCD COMPRESS KNEE MED (STOCKING) ×2 IMPLANT
SPIKE FLUID TRANSFER (MISCELLANEOUS) IMPLANT
SPONGE T-LAP 18X18 ~~LOC~~+RFID (SPONGE) ×4 IMPLANT
STRIP CLOSURE SKIN 1/2X4 (GAUZE/BANDAGES/DRESSINGS) ×2 IMPLANT
STRIP SUTURE WOUND CLOSURE 1/2 (MISCELLANEOUS) ×2 IMPLANT
SUT MNCRL AB 3-0 PS2 18 (SUTURE) IMPLANT
SUT MNCRL AB 4-0 PS2 18 (SUTURE) ×2 IMPLANT
SUT MON AB 3-0 SH27 (SUTURE) ×2 IMPLANT
SUT MON AB 4-0 PS1 27 (SUTURE) ×2 IMPLANT
SUT MON AB 5-0 PS2 18 (SUTURE) ×2 IMPLANT
SUT PDS 3-0 CT2 (SUTURE) ×4
SUT PDS AB 2-0 CT2 27 (SUTURE) IMPLANT
SUT PDS II 3-0 CT2 27 ABS (SUTURE) IMPLANT
SUT SILK 3 0 PS 1 (SUTURE) IMPLANT
SUT VIC AB 3-0 SH 27X BRD (SUTURE) ×2 IMPLANT
SUT VIC AB 4-0 PS2 18 (SUTURE) IMPLANT
SYR BULB IRRIG 60ML STRL (SYRINGE) ×2 IMPLANT
SYR CONTROL 10ML LL (SYRINGE) ×2 IMPLANT
TAPE MEASURE VINYL STERILE (MISCELLANEOUS) ×2 IMPLANT
TOWEL GREEN STERILE FF (TOWEL DISPOSABLE) ×4 IMPLANT
TRAY DSU PREP LF (CUSTOM PROCEDURE TRAY) ×2 IMPLANT
TUBE CONNECTING 20X1/4 (TUBING) ×2 IMPLANT
UNDERPAD 30X36 HEAVY ABSORB (UNDERPADS AND DIAPERS) ×4 IMPLANT
YANKAUER SUCT BULB TIP NO VENT (SUCTIONS) ×2 IMPLANT

## 2023-07-27 NOTE — Discharge Instructions (Addendum)
INSTRUCTIONS FOR AFTER BREAST SURGERY   You will likely have some questions about what to expect following your operation.  The following information will help you and your family understand what to expect when you are discharged from the hospital.  It is important to follow these guidelines to help ensure a smooth recovery and reduce complication.  Postoperative instructions include information on: diet, wound care, medications and physical activity.  AFTER SURGERY Expect to go home after the procedure.  In some cases, you may need to spend one night in the hospital for observation.  DIET Breast surgery does not require a specific diet.  However, the healthier you eat the better your body will heal. It is important to increasing your protein intake.  This means limiting the foods with sugar and carbohydrates.  Focus on vegetables and some meat.  If you have liposuction during your procedure be sure to drink water.  If your urine is bright yellow, then it is concentrated, and you need to drink more water.  As a general rule after surgery, you should have 8 ounces of water every hour while awake.  If you find you are persistently nauseated or unable to take in liquids let us know.  NO TOBACCO USE or EXPOSURE.  This will slow your healing process and lead to a wound.  WOUND CARE Leave the binder on for 3 days . Use fragrance free soap like Dial, Dove or [place].   After 3 days you can remove the binder to shower. Once dry apply binder or sports bra. If you have liposuction you will have a soft and spongy dressing (Lipofoam) that helps prevent creases in your skin.  Remove before you shower and then replace it.  It is also available on [employer]. If you have steri-strips / tape directly attached to your skin leave them in place. It is OK to get these wet.   No baths, pools or hot tubs for four weeks. We close your incision to leave the smallest and best-looking scar. No ointment or creams on your incisions  for four weeks.  No Neosporin (Too many skin reactions).  A few weeks after surgery you can use Mederma and start massaging the scar. We ask you to wear your binder or sports bra for the first 6 weeks around the clock, including while sleeping. This provides added comfort and helps reduce the fluid accumulation at the surgery site. NO Ice or heating pads to the operative site.  You have a very high risk of a BURN before you feel the temperature change.  ACTIVITY No heavy lifting until cleared by the doctor.  This usually means no more than a half-gallon of milk.  It is OK to walk and climb stairs. Moving your legs is very important to decrease your risk of a blood clot.  It will also help keep you from getting deconditioned.  Every 1 to 2 hours get up and walk for 5 minutes. This will help with a quicker recovery back to normal.  Let pain be your guide so you don't do too much.  This time is for you to recover.  You will be more comfortable if you sleep and rest with your head elevated either with a few pillows under you or in a recliner.  No stomach sleeping for a three months.  WORK Everyone returns to work at different times. As a rough guide, most people take at least 1 - 2 weeks off prior to returning to work. If  you need documentation for your job, give the forms to the front staff at the clinic.  DRIVING Arrange for someone to bring you home from the hospital after your surgery.  You may be able to drive a few days after surgery but not while taking any narcotics or valium.  BOWEL MOVEMENTS Constipation can occur after anesthesia and while taking pain medication.  It is important to stay ahead for your comfort.  We recommend taking Milk of Magnesia (2 tablespoons; twice a day) while taking the pain pills.  MEDICATIONS You may be prescribed should start after surgery At your preoperative visit for you history and physical you may have been given the following medications: An antibiotic: Start  this medication when you get home and take according to the instructions on the bottle. Zofran 4 mg:  This is to treat nausea and vomiting.  You can take this every 6 hours as needed and only if needed. Valium 2 mg for breast cancer patients: This is for muscle tightness if you have an implant or expander. This will help relax your muscle which also helps with pain control.  This can be taken every 12 hours as needed. Don't drive after taking this medication. Norco (hydrocodone/acetaminophen) 5/325 mg:  This is only to be used after you have taken the Motrin or the Tylenol. Every 8 hours as needed.   Over the counter Medication to take: Ibuprofen (Motrin) 600 mg:  Take this every 6 hours.  If you have additional pain then take 500 mg of the Tylenol every 8 hours.  Only take the Norco after you have tried these two. MiraLAX or Milk of Magnesia: Take this according to the bottle if you take the Norco.  WHEN TO CALL Call your surgeon's office if any of the following occur: Fever 101 degrees F or greater Excessive bleeding or fluid from the incision site. Pain that increases over time without aid from the medications Redness, warmth, or pus draining from incision sites Persistent nausea or inability to take in liquids Severe misshapen area that underwent the operation.    Post Anesthesia Home Care Instructions  Activity: Get plenty of rest for the remainder of the day. A responsible individual must stay with you for 24 hours following the procedure.  For the next 24 hours, DO NOT: -Drive a car -Advertising copywriter -Drink alcoholic beverages -Take any medication unless instructed by your physician -Make any legal decisions or sign important papers.  Meals: Start with liquid foods such as gelatin or soup. Progress to regular foods as tolerated. Avoid greasy, spicy, heavy foods. If nausea and/or vomiting occur, drink only clear liquids until the nausea and/or vomiting subsides. Call your  physician if vomiting continues.  Special Instructions/Symptoms: Your throat may feel dry or sore from the anesthesia or the breathing tube placed in your throat during surgery. If this causes discomfort, gargle with warm salt water. The discomfort should disappear within 24 hours.  If you had a scopolamine patch placed behind your ear for the management of post- operative nausea and/or vomiting:  1. The medication in the patch is effective for 72 hours, after which it should be removed.  Wrap patch in a tissue and discard in the trash. Wash hands thoroughly with soap and water. 2. You may remove the patch earlier than 72 hours if you experience unpleasant side effects which may include dry mouth, dizziness or visual disturbances. 3. Avoid touching the patch. Wash your hands with soap and water after contact with  the patch.     Next dose of Tylenol may be given at 2pm if needed.

## 2023-07-27 NOTE — Anesthesia Procedure Notes (Signed)
Procedure Name: LMA Insertion Date/Time: 07/27/2023 10:49 AM  Performed by: Ronnette Hila, CRNAPre-anesthesia Checklist: Patient identified, Emergency Drugs available, Suction available and Patient being monitored Patient Re-evaluated:Patient Re-evaluated prior to induction Oxygen Delivery Method: Circle system utilized Preoxygenation: Pre-oxygenation with 100% oxygen Induction Type: IV induction Ventilation: Mask ventilation without difficulty LMA: LMA inserted LMA Size: 4.0 Number of attempts: 1 Airway Equipment and Method: Bite block Placement Confirmation: positive ETCO2 Tube secured with: Tape Dental Injury: Teeth and Oropharynx as per pre-operative assessment

## 2023-07-27 NOTE — Transfer of Care (Signed)
Immediate Anesthesia Transfer of Care Note  Patient: Krystal Bennett  Procedure(s) Performed: REMOVAL OF TISSUE EXPANDER AND PLACEMENT OF IMPLANT (Right: Chest) BREAST REDUCTION WITH MASTOPEXY (Left: Breast)  Patient Location: PACU  Anesthesia Type:General  Level of Consciousness: sedated  Airway & Oxygen Therapy: Patient Spontanous Breathing and Patient connected to face mask oxygen  Post-op Assessment: Report given to RN and Post -op Vital signs reviewed and stable  Post vital signs: Reviewed and stable  Last Vitals:  Vitals Value Taken Time  BP    Temp    Pulse 74 07/27/23 1212  Resp    SpO2 95 % 07/27/23 1212  Vitals shown include unfiled device data.  Last Pain:  Vitals:   07/27/23 0752  TempSrc: Oral  PainSc: 0-No pain      Patients Stated Pain Goal: 3 (07/27/23 0752)  Complications: No notable events documented.

## 2023-07-27 NOTE — Op Note (Signed)
Op note:    DATE OF PROCEDURE: 07/27/2023  LOCATION: [facility]  SURGEON: Foster Simpson, DO  ASSISTANT: Evelena Leyden, PA  PREOPERATIVE DIAGNOSIS History of right breast cancer Acquired absence of right breast Breast asymmetry with left breast  POSTOPERATIVE DIAGNOSIS Same as preoperative diagnosis  PROCEDURES Exchange of right tissue expander for implant. Right circumferential capsulotomies for implant respositioning. Left breast reduction/mastopexy 10  g.  ANESTHESIA:  General.   COMPLICATIONS: None.   IMPLANTS: Right breast: [place] Smooth Round Ultra High Profile Gel 480 cc. Ref #[id number].  Serial Number [id number]  INDICATIONS FOR PROCEDURE:  The patient, Krystal Bennett, is a 67 y.o. female born on 01/29/1956, is here for further treatment after a mastectomy and placement of a tissue expander. She now presents for exchange of her expander for an implant.  She requires capsulotomies to better position the implant. MRN: [id number]  CONSENT:  Informed consent was obtained directly from the patient. Risks, benefits and alternatives were fully discussed. Specific risks including but not limited to bleeding, infection, hematoma, seroma, scarring, pain, implant infection, implant extrusion, capsular contracture, asymmetry, wound healing problems, and need for further surgery were all discussed. The patient did have an ample opportunity to have her questions answered to her satisfaction.    DESCRIPTION OF PROCEDURE  Patient was brought into the operating room and rested on the operating room table in the supine position.  SCDs were placed and appropriate padding was performed.  Antibiotics were given. The patient underwent general anesthesia and the chest was prepped and draped in a sterile fashion.  A timeout was performed and all information was confirmed to be correct by those in the room.  Left side: Preoperative markings were confirmed.   Incision lines were injected with local containing epinephrine.  After waiting for vasoconstriction, the marked lines were incised with a #10 blade.  A lollipop type incision was made.  The areola was marked and the surrounding skin was de-epithelialized. A vertical limb was created in order to give the patient a lift.  Hemostasis was achieved.  The nipple was gently lifted into position and the soft tissue was closed with 3-0 and 4-0 Monocryl.  The deep tissues were approximated with 3-0 PDS sutures. The skin was closed with deep dermal 3-0 Monocryl and subcuticular 4-0 Monocryl sutures.  Dermabond was applied.  A breast binder and ABDs were placed.  The nipple and skin flaps had good capillary refill at the end of the procedure.  The patient tolerated the procedure well. The patient was allowed to wake from anesthesia and taken to the recovery room in satisfactory condition.  Right: The old mastectomy scar was excised and superior mastectomy and inferior mastectomy flaps were re-raised over the pectoralis major muscle and ADM. The ADM was split to expose the tissue expander which was removed. Inspection of the pocket showed a normal healthy capsule and good integration of the biologic matrix.  Circumferential capsulotomies were performed to allow for breast pocket expansion.  Liposuction was done on the medial and lateral aspect for better contouring of the breast. Measurements were made to confirm adequate pocket size for the implant dimensions. The 535 cc sizer was too tight. Hemostasis was ensured with electrocautery.  The pocket was irrigated with antibiotic solution.  New gloves were placed.  The implant was placed in the pocket and oriented appropriately. The pectoralis major muscle and capsule on the anterior surface were re-closed with a 3-0 running Monocryl suture. The remaining skin was closed  with 4-0 Monocryl deep dermal and 5-0 Monocryl subcuticular stitches.  Dermabond was applied.  A breast binder  and ABD was applied.  The patient was allowed to wake from anesthesia and taken to the recovery room in satisfactory condition.   The advanced practice practitioner (APP) assisted throughout the case.  The APP was essential in retraction and counter traction when needed to make the case progress smoothly.  This retraction and assistance made it possible to see the tissue plans for the procedure.  The assistance was needed for blood control, tissue re-approximation and assisted with closure of the incision site.

## 2023-07-27 NOTE — Anesthesia Postprocedure Evaluation (Signed)
Anesthesia Post Note  Patient: Krystal Bennett  Procedure(s) Performed: REMOVAL OF TISSUE EXPANDER AND PLACEMENT OF IMPLANT (Right: Chest) BREAST REDUCTION WITH MASTOPEXY (Left: Breast)     Patient location during evaluation: PACU Anesthesia Type: General Level of consciousness: sedated and patient cooperative Pain management: pain level controlled Vital Signs Assessment: post-procedure vital signs reviewed and stable Respiratory status: spontaneous breathing Cardiovascular status: stable Anesthetic complications: no   No notable events documented.  Last Vitals:  Vitals:   07/27/23 1245 07/27/23 1300  BP: (!) 144/99   Pulse: 63 65  Resp: 19 20  Temp:    SpO2: 97% 98%    Last Pain:  Vitals:   07/27/23 1245  TempSrc:   PainSc: 0-No pain                 Lewie Loron

## 2023-07-27 NOTE — Interval H&P Note (Signed)
History and Physical Interval Note:  07/27/2023 10:12 AM  Krystal Bennett  has presented today for surgery, with the diagnosis of hx of breast cancer.  The various methods of treatment have been discussed with the patient and family. After consideration of risks, benefits and other options for treatment, the patient has consented to  Procedure(s): REMOVAL OF TISSUE EXPANDER AND PLACEMENT OF IMPLANT (Right) BREAST REDUCTION WITH MASTOPEXY (Left) as a surgical intervention.  The patient's history has been reviewed, patient examined, no change in status, stable for surgery.  I have reviewed the patient's chart and labs.  Questions were answered to the patient's satisfaction.     Krystal Bennett

## 2023-07-28 ENCOUNTER — Encounter (HOSPITAL_BASED_OUTPATIENT_CLINIC_OR_DEPARTMENT_OTHER): Payer: Self-pay | Admitting: Plastic Surgery

## 2023-07-29 ENCOUNTER — Ambulatory Visit (INDEPENDENT_AMBULATORY_CARE_PROVIDER_SITE_OTHER): Payer: Medicare HMO | Admitting: Physician Assistant

## 2023-07-29 DIAGNOSIS — Z9889 Other specified postprocedural states: Secondary | ICD-10-CM

## 2023-07-29 LAB — SURGICAL PATHOLOGY

## 2023-07-29 NOTE — Progress Notes (Signed)
Patient is a pleasant 67 year old female with history of right breast cancer s/p removal of right breast tissue expander and placement of implant with left breast mastopexy for symmetry performed 07/27/2023 by Dr. Ulice Bold who joins via telephone for postoperative day 1 check-in.  Reviewed operative report and right circumferential capsulotomies were performed for implant repositioning and a 480 cc smooth round ultrahigh profile gel implant was placed.  Liposuction was performed on the medial and lateral aspects for better contouring of the breast.  After the right breast implant was placed, the left side was lifted for symmetry.  Today, she is doing well from a postoperative standpoint.  Denies any significant pain symptoms.  She feels much better with the implant rather than the expander in place.  She states that it does not feel too tight and that she is continuing to wear the compressive binder.  She is tolerating p.o. intake without difficulty, voiding.  Ambulatory at home.  Inquired about the COVID immunization and will hold off until she is 2 weeks postop from surgery.  She denies any leg swelling, chest pain, difficulty breathing, or fevers.  Plan for her to follow-up in person next week as scheduled, but she can certainly call the clinic should she have any questions or concerns in interim.

## 2023-08-02 ENCOUNTER — Ambulatory Visit (INDEPENDENT_AMBULATORY_CARE_PROVIDER_SITE_OTHER): Payer: Medicare HMO | Admitting: Plastic Surgery

## 2023-08-02 ENCOUNTER — Encounter: Payer: Self-pay | Admitting: Plastic Surgery

## 2023-08-02 DIAGNOSIS — D0511 Intraductal carcinoma in situ of right breast: Secondary | ICD-10-CM

## 2023-08-02 NOTE — Progress Notes (Signed)
The patient is a 68 year old female here for follow-up after surgery on November 20.  She had exchange of a right breast expander for an implant.  She has a Dance movement psychotherapist smooth round ultra high-profile gel 480 cc implant in place.  When we did a mastopexy reduction to the left breast.  Overall she is doing really well.  She has control over the pain.  There are no signs of a hematoma or infection.  I went ahead and removed the dressings.  Steri-Strips are still in place.  Continue with the binder or sports bra.  Follow-up in 2 weeks.  Pictures were obtained of the patient and placed in the chart with the patient's or guardian's permission.

## 2023-08-16 ENCOUNTER — Encounter: Payer: Medicare HMO | Admitting: Physician Assistant

## 2023-08-16 ENCOUNTER — Ambulatory Visit: Payer: Medicare HMO | Admitting: Physician Assistant

## 2023-08-16 ENCOUNTER — Encounter: Payer: Self-pay | Admitting: Physician Assistant

## 2023-08-16 VITALS — BP 175/92 | HR 75

## 2023-08-16 DIAGNOSIS — D0511 Intraductal carcinoma in situ of right breast: Secondary | ICD-10-CM

## 2023-08-16 NOTE — Progress Notes (Signed)
Patient is a pleasant 67 year old female with history of right breast cancer s/p removal of right breast tissue expander and placement of implant with left breast mastopexy for symmetry performed 07/27/2023 by Dr. Ulice Bold who presents to clinic for postoperative follow-up.  Circumferential capsulotomy performed for implant repositioning and 480 cc smooth round high-profile gel implant was placed.  Liposuction was performed.  She was last seen in the clinic on 08/02/2023.  At that time, she was doing well and exam is benign.  Pictures were obtained and placed in chart.  Follow-up as scheduled.  Today, patient is doing well.  No specific complaints.  Inquires about ongoing activity restrictions.  She is pleased with the improvement in her symmetry.  She has not been applying any gels or ointments to her breasts and reports that the Steri-Strips remain intact.  On exam, breasts have improved shape and symmetry.  Left NAC is healthy and viable.  Steri-Strips are all removed without complication or difficulty.  No obvious incisional wounds.  Residual Dermabond noted, mostly along left NAC.  Recommending continued compressive garments and activity modifications.  Vaseline to her incisions throughout.  Follow-up later this month, as scheduled.  At that time, can transition to silicone scar gels twice daily x 3 months and likely follow-up only as needed.  Patient's blood pressure is again quite elevated in clinic.  She denies any symptoms.  She states that earlier this morning she checked her blood pressure and it was 140s systolic.  She recently started amlodipine per her PCP.  She will continue to monitor her blood pressure at home and follow-up with PCP as needed.  Picture(s) obtained of the patient and placed in the chart were with the patient's or guardian's permission.

## 2023-09-01 NOTE — Progress Notes (Signed)
Patient is a pleasant 67 year old female with history of right breast cancer s/p removal of right breast tissue expander and placement of implant with left breast mastopexy for symmetry performed 07/27/2023 by Dr. Ulice Bold who presents to clinic for postoperative follow-up.   She was last seen here in clinic on 08/16/2023.  At that time, she was doing well and exam was benign.  Discussed ongoing activity modifications and compressive garments.  Follow-up as scheduled.  Today the patient is doing well.  No specific complaints.  She does have a few scattered sutures/residual Dermabond around the left areola.  Otherwise, no complaints.  She already has her 1 year post reconstruction follow-up scheduled with Dr. Ulice Bold.  On exam, patient is well-healed.  A few scattered sutures are removed without complication or difficulty.  No evidence concerning for infection.  No appreciable underlying fluid collections.  At this time, can increase activity as tolerated.  Recommending Vaseline to where the sutures were removed x 4 days.  She can transition to silicone scar gels twice daily x 3 months.  Follow-up as scheduled with Dr. Ulice Bold, but she can certainly call the office should she have questions or concerns in the interim.  Pictures were obtained at last visit.

## 2023-09-02 ENCOUNTER — Ambulatory Visit (INDEPENDENT_AMBULATORY_CARE_PROVIDER_SITE_OTHER): Payer: Medicare HMO | Admitting: Physician Assistant

## 2023-09-02 VITALS — BP 165/97 | HR 69

## 2023-09-02 DIAGNOSIS — D0511 Intraductal carcinoma in situ of right breast: Secondary | ICD-10-CM

## 2023-09-05 ENCOUNTER — Encounter: Payer: Self-pay | Admitting: Internal Medicine

## 2023-09-05 ENCOUNTER — Other Ambulatory Visit: Payer: Self-pay

## 2023-09-05 ENCOUNTER — Inpatient Hospital Stay: Payer: Medicare HMO | Attending: Internal Medicine

## 2023-09-05 ENCOUNTER — Inpatient Hospital Stay (HOSPITAL_BASED_OUTPATIENT_CLINIC_OR_DEPARTMENT_OTHER): Payer: Medicare HMO | Admitting: Internal Medicine

## 2023-09-05 VITALS — BP 137/79 | HR 64 | Temp 96.9°F | Ht 66.0 in | Wt 157.0 lb

## 2023-09-05 DIAGNOSIS — Z79811 Long term (current) use of aromatase inhibitors: Secondary | ICD-10-CM

## 2023-09-05 DIAGNOSIS — E890 Postprocedural hypothyroidism: Secondary | ICD-10-CM | POA: Insufficient documentation

## 2023-09-05 DIAGNOSIS — Z170 Estrogen receptor positive status [ER+]: Secondary | ICD-10-CM | POA: Insufficient documentation

## 2023-09-05 DIAGNOSIS — E785 Hyperlipidemia, unspecified: Secondary | ICD-10-CM | POA: Diagnosis not present

## 2023-09-05 DIAGNOSIS — D0511 Intraductal carcinoma in situ of right breast: Secondary | ICD-10-CM | POA: Insufficient documentation

## 2023-09-05 DIAGNOSIS — Z1721 Progesterone receptor positive status: Secondary | ICD-10-CM | POA: Diagnosis not present

## 2023-09-05 DIAGNOSIS — Z9011 Acquired absence of right breast and nipple: Secondary | ICD-10-CM | POA: Diagnosis not present

## 2023-09-05 DIAGNOSIS — I10 Essential (primary) hypertension: Secondary | ICD-10-CM | POA: Diagnosis not present

## 2023-09-05 LAB — CBC WITH DIFFERENTIAL/PLATELET
Abs Immature Granulocytes: 0.01 10*3/uL (ref 0.00–0.07)
Basophils Absolute: 0 10*3/uL (ref 0.0–0.1)
Basophils Relative: 1 %
Eosinophils Absolute: 0 10*3/uL (ref 0.0–0.5)
Eosinophils Relative: 1 %
HCT: 36.1 % (ref 36.0–46.0)
Hemoglobin: 12 g/dL (ref 12.0–15.0)
Immature Granulocytes: 0 %
Lymphocytes Relative: 38 %
Lymphs Abs: 2.1 10*3/uL (ref 0.7–4.0)
MCH: 30.2 pg (ref 26.0–34.0)
MCHC: 33.2 g/dL (ref 30.0–36.0)
MCV: 90.7 fL (ref 80.0–100.0)
Monocytes Absolute: 0.3 10*3/uL (ref 0.1–1.0)
Monocytes Relative: 5 %
Neutro Abs: 3.1 10*3/uL (ref 1.7–7.7)
Neutrophils Relative %: 55 %
Platelets: 237 10*3/uL (ref 150–400)
RBC: 3.98 MIL/uL (ref 3.87–5.11)
RDW: 12.5 % (ref 11.5–15.5)
WBC: 5.6 10*3/uL (ref 4.0–10.5)
nRBC: 0 % (ref 0.0–0.2)

## 2023-09-05 LAB — CMP (CANCER CENTER ONLY)
ALT: 22 U/L (ref 0–44)
AST: 28 U/L (ref 15–41)
Albumin: 3.9 g/dL (ref 3.5–5.0)
Alkaline Phosphatase: 92 U/L (ref 38–126)
Anion gap: 7 (ref 5–15)
BUN: 15 mg/dL (ref 8–23)
CO2: 25 mmol/L (ref 22–32)
Calcium: 9.1 mg/dL (ref 8.9–10.3)
Chloride: 107 mmol/L (ref 98–111)
Creatinine: 0.81 mg/dL (ref 0.44–1.00)
GFR, Estimated: >60 mL/min (ref >60)
Glucose, Bld: 98 mg/dL (ref 70–99)
Potassium: 3.9 mmol/L (ref 3.5–5.1)
Sodium: 139 mmol/L (ref 135–145)
Total Bilirubin: 0.5 mg/dL (ref 0.0–1.2)
Total Protein: 7 g/dL (ref 6.5–8.1)

## 2023-09-05 NOTE — Progress Notes (Signed)
Pt states she had reconstructive surgery of the rt breast 07/2023.

## 2023-09-05 NOTE — Progress Notes (Signed)
[facility] CONSULT NOTE  Patient Care Team: Luciana Axe, NP as PCP - General (Family Medicine) Hulen Luster, RN as Oncology Nurse Navigator Michaelyn Barter, MD as Consulting Physician (Oncology)  CANCER STAGING   Cancer Staging  DCIS (ductal carcinoma in situ) Staging form: Breast, AJCC 8th Edition - Clinical stage from 02/03/2023: Stage 0 (cTis (DCIS), cN0, cM0, G3, ER: Unknown, PR: Unknown) - Signed by Michaelyn Barter, MD on 02/10/2023 Stage prefix: Initial diagnosis Nuclear grade: G3 Histologic grading system: 3 grade system   ASSESSMENT & PLAN:  Krystal Bennett 67 y.o. 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  # Right breast DCIS -Detected on screening mammogram.  A span of 6.5 cm suspicious calcifications present in the medial right breast.  2 areas were biopsied right posterior upper inner quadrant and right medial retroareolar.  For both pathology showing high-grade DCIS comedo type necrosis.  ER testing deferred to excisional specimen.  - s/p right simple mastectomy with immediate reconstruction By Dr. Carolynne Edouard and Dr. Ulice Bold on 04/18/2023.  Pathology showed DCIS, solid, nuclear grade 2-3 with necrosis, 2.5 x 1.1 x 1 cm, margins negative, closest 15 mm, 0/2 lymph node negative for malignancy.  ER 100% positive, PR 60% positive.  -Considering patient had mastectomy, there is no indication for adjuvant radiation.  -Status post exchange of right tissue expander for an implant.  Left breast reduction on 07/27/2023 by Dr. Ulice Bold.  -Started on anastrozole 1 mg daily on 08/22/2023.  Tolerating well.  Will schedule her for left breast diagnostic mammogram due in April 2025.  I will follow-up with her after that to also  assess tolerance to anastrozole.  # Bone health-DEXA scan from September 2024 showed normal bone density.  Continue with calcium vitamin D supplements. -# Hyperlipidemia-on Lipitor # Hypertension-on losartan # Hypothyroidism-on Synthroid  Orders Placed This Encounter  Procedures   MM 3D DIAGNOSTIC MAMMOGRAM UNILATERAL LEFT BREAST    Standing Status:   Future    Expected Date:   01/04/2024    Expiration Date:   09/04/2024    Reason for Exam (SYMPTOM  OR DIAGNOSIS REQUIRED):   annual exam, follo wup breast cacner    Preferred imaging location?:   [facility]   CBC with Differential/Platelet    Standing Status:   Future    Number of Occurrences:   1    Expected Date:   09/05/2023    Expiration Date:   09/04/2024   CMP (Cancer Center only)    Standing Status:   Future    Number of Occurrences:   1    Expected Date:   09/05/2023    Expiration Date:   09/04/2024   CBC with Differential/Platelet    Standing Status:   Future    Expected Date:   09/06/2023    Expiration Date:   09/04/2024   CMP (Cancer Center only)    Standing Status:   Future    Expected Date:   09/06/2023    Expiration Date:   09/04/2024   RTC in 5 months for MD visit, labs, mammo prior.  The total time spent in the appointment was 30 minutes encounter with patients including review of chart and various tests results, discussions about plan of care and coordination of care plan   All questions were answered.  The patient knows to call the clinic with any problems, questions or concerns. No barriers to learning was detected.  Michaelyn Barter, MD 12/30/202412:35 PM   HISTORY OF PRESENTING ILLNESS:  Krystal Bennett 67 y.o. 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  Interval history Patient seen today as follow-up for right breast DCIS on anastrozole. She had reconstructive surgery with Dr. Ulice Bold in November 2024.  Doing well did anastrozole 2 weeks ago.  Denies any hot flashes, myalgias or arthralgias.  I have reviewed her chart and materials related to her cancer extensively and collaborated history with the patient. Summary of oncologic history is as follows: Oncology History  DCIS (ductal carcinoma in situ)  12/23/2022 Mammogram   Screening mammogram FINDINGS: In the right breast, calcifications warrant further evaluation with magnified views. In the left breast, no findings suspicious for malignancy.  Diagnostic mammogram IMPRESSION: Suspicious calcifications in the medial right breast spanning 6.5 cm.   02/03/2023 Cancer Staging   Staging form: Breast, AJCC 8th Edition - Clinical stage from 02/03/2023: Stage 0 (cTis (DCIS), cN0, cM0, G3, ER: Unknown, PR: Unknown) - Signed by Michaelyn Barter, MD on 02/10/2023 Stage prefix: Initial diagnosis Nuclear grade: G3 Histologic grading system: 3 grade system   02/03/2023 Initial Biopsy   DIAGNOSIS:  A. BREAST CALCIFICATIONS, RIGHT POSTERIOR UPPER INNER QUADRANT;  STEREOTACTIC BIOPSY:  - HIGH-GRADE DUCTAL CARCINOMA IN SITU (DCIS), COMEDO-TYPE WITH  CALCIFICATIONS.   B. BREAST CALCIFICATIONS, RIGHT MEDIAL RETROAREOLAR; STEREOTACTIC  BIOPSY:  - HIGH-GRADE DUCTAL CARCINOMA IN SITU (DCIS), COMEDO-TYPE WITH  CALCIFICATIONS.   Comment:  Testing for estrogen receptor is deferred to an excision specimen.    02/10/2023 Initial Diagnosis   DCIS (ductal carcinoma in situ)    Menarche-age 68 or 17 Children 2 son Age at first birth 28 Use of OCP more than 5 years Menopause age 54s HRT no History of breast biopsies normal Family history of prostate cancer in father and lung cancer in mother.  MEDICAL HISTORY:  Past Medical History:  Diagnosis Date   HTN (hypertension)     Hyperlipidemia     SURGICAL HISTORY: Past Surgical History:  Procedure Laterality Date   BREAST BIOPSY Right 02/03/2023   stereo calcs #1 posterior group, coil marker, path pending   BREAST BIOPSY Right 02/03/2023   stereo bx calcs #2 anterior group, x marker, path pending   BREAST BIOPSY Right 02/03/2023   MM RT BREAST BX W LOC DEV EA AD LESION IMG BX SPEC STEREO GUIDE 02/03/2023 [facility]   BREAST BIOPSY Right 02/03/2023   MM RT BREAST BX W LOC DEV 1ST LESION IMAGE BX SPEC STEREO GUIDE 02/03/2023 [facility]   BREAST RECONSTRUCTION WITH PLACEMENT OF TISSUE EXPANDER AND FLEX HD (ACELLULAR HYDRATED DERMIS) Right 04/18/2023   Procedure: IMMEDIATE BREAST RECONSTRUCTION WITH PLACEMENT OF TISSUE EXPANDER AND FLEX HD (ACELLULAR HYDRATED DERMIS);  Surgeon: Peggye Form, DO;  Location: [facility];  Service: Plastics;  Laterality: Right;   BREAST REDUCTION WITH MASTOPEXY Left 07/27/2023   Procedure: BREAST REDUCTION WITH MASTOPEXY;  Surgeon: Peggye Form, DO;  Location: [facility];  Service: Plastics;  Laterality: Left;   CESAREAN SECTION     MASTECTOMY W/ SENTINEL NODE BIOPSY Right 04/18/2023   Procedure: RIGHT MASTECTOMY WITH SENTINEL LYMPH NODE BIOPSY;  Surgeon: Griselda Miner, MD;  Location: [facility];  Service: General;  Laterality: Right;  PEC BLOCK   REMOVAL OF TISSUE EXPANDER AND PLACEMENT OF IMPLANT Right 07/27/2023   Procedure: REMOVAL OF TISSUE EXPANDER AND PLACEMENT OF IMPLANT;  Surgeon: Peggye Form, DO;  Location: [facility];  Service: Plastics;  Laterality: Right;   THYROIDECTOMY N/A 10/04/2016   Procedure: THYROIDECTOMY;  Surgeon: Vernie Murders, MD;  Location: [facility];  Service: ENT;  Laterality: N/A;   WISDOM TOOTH EXTRACTION      SOCIAL HISTORY: Social History   Socioeconomic History   Marital status: Single    Spouse name: Not on file   Number of children: Not on file   Years of  education: Not on file   Highest education level: Not on file  Occupational History   Not on file  Tobacco Use   Smoking status: Never    Passive exposure: Never   Smokeless tobacco: Never  Vaping Use   Vaping status: Never Used  Substance and Sexual Activity   Alcohol use: Yes    Comment: OCC WINE   Drug use: No   Sexual activity: Not on file  Other Topics Concern   Not on file  Social History Narrative   Not on file   Social Drivers of Health   Financial Resource Strain: Low Risk  (08/05/2017)   Received from [facility], [facility]   Overall Financial Resource Strain (CARDIA)    Difficulty of Paying Living Expenses: Not hard at all  Food Insecurity: No Food Insecurity (02/10/2023)   Hunger Vital Sign    Worried About Running Out of Food in the Last Year: Never true    Ran Out of Food in the Last Year: Never true  Transportation Needs: No Transportation Needs (02/10/2023)   PRAPARE - Administrator, Civil Service (Medical): No    Lack of Transportation (Non-Medical): No  Physical Activity: Insufficiently Active (08/05/2017)   Received from [facility], [facility]   Exercise Vital Sign    Days of Exercise per Week: 3 days    Minutes of Exercise per Session: 40 min  Stress: No Stress Concern Present (08/05/2017)   Received from [facility], [facility]   [employer] of Occupational Health - Occupational Stress Questionnaire    Feeling of Stress : Not at all  Social Connections: Moderately Integrated (08/05/2017)   Received from [facility], [facility]   Social Connection and Isolation Panel [NHANES]    Frequency of Communication with Friends and Family: More than three times a week    Frequency of Social Gatherings with Friends and Family: More than three times a week    Attends Religious Services: More than 4  times per year    Active Member of [employer] or Organizations: Yes    Attends Banker Meetings: 1 to 4 times per year    Marital Status: Divorced  Intimate Partner Violence: Not At Risk (02/10/2023)   Humiliation, Afraid, Rape, and Kick questionnaire    Fear of Current or Ex-Partner: No    Emotionally Abused: No    Physically Abused: No    Sexually Abused: No    FAMILY HISTORY: History reviewed. No pertinent family history.  ALLERGIES:  has no known allergies.  MEDICATIONS:  Current Outpatient Medications  Medication Sig Dispense Refill   amLODipine (NORVASC) 5 MG tablet Take 5 mg by mouth  daily.     anastrozole (ARIMIDEX) 1 MG tablet Take 1 mg by mouth daily.     atorvastatin (LIPITOR) 20 MG tablet Take 0.5 tablets by mouth daily at 6 PM.     b complex vitamins capsule Take 2 capsules by mouth daily.     Calcium Carb-Cholecalciferol (CALCIUM HIGH POTENCY/VITAMIN D) 600-5 MG-MCG TABS Take by mouth.     Cholecalciferol (VITAMIN D3) 50 MCG (2000 UT) capsule Take 2,000 Units by mouth daily.     COLLAGEN PO Take 1 each by mouth daily. Vital Proteins Collagen Peptides     diazepam (VALIUM) 2 MG tablet Take 1 tablet (2 mg total) by mouth every 12 (twelve) hours as needed for muscle spasms. 20 tablet 0   levothyroxine (SYNTHROID) 88 MCG tablet Take 88 mcg by mouth daily.     losartan (COZAAR) 100 MG tablet Take 100 mg by mouth daily.     Multiple Vitamins-Minerals (MULTIVITAMIN WOMENS 50+ ADV PO) Take 2 tablets by mouth daily.     NON FORMULARY Take 2 tablets by mouth daily. Propolis Complete Gut Health     NON FORMULARY Take by mouth daily. Propolis, Royal Jelly, Bee Pollin     Omega-3 Fatty Acids (FISH OIL) 1000 MG CAPS Take 2 capsules by mouth daily.     ondansetron (ZOFRAN-ODT) 4 MG disintegrating tablet Take 1 tablet (4 mg total) by mouth every 8 (eight) hours as needed for nausea or vomiting. 20 tablet 0   No current facility-administered medications for this visit.     REVIEW OF SYSTEMS:   Pertinent information mentioned in HPI All other systems were reviewed with the patient and are negative.  PHYSICAL EXAMINATION: ECOG PERFORMANCE STATUS: 0 - Asymptomatic  Vitals:   09/05/23 1025  BP: 137/79  Pulse: 64  Temp: (!) 96.9 F (36.1 C)  SpO2: 100%   Filed Weights   09/05/23 1025  Weight: 157 lb (71.2 kg)    GENERAL:alert, no distress and comfortable SKIN: skin color, texture, turgor are normal, no rashes or significant lesions EYES: normal, conjunctiva are pink and non-injected, sclera clear OROPHARYNX:no exudate, no erythema and lips, buccal mucosa, and tongue normal  NECK: supple, thyroid normal size, non-tender, without nodularity LYMPH:  no palpable lymphadenopathy in the cervical, axillary or inguinal LUNGS: clear to auscultation and percussion with normal breathing effort HEART: regular rate & rhythm and no murmurs and no lower extremity edema ABDOMEN:abdomen soft, non-tender and normal bowel sounds Musculoskeletal:no cyanosis of digits and no clubbing  PSYCH: alert & oriented x 3 with fluent speech NEURO: no focal motor/sensory deficits  LABORATORY DATA:  I have reviewed the data as listed Lab Results  Component Value Date   WBC 5.6 09/05/2023   HGB 12.0 09/05/2023   HCT 36.1 09/05/2023   MCV 90.7 09/05/2023   PLT 237 09/05/2023   Recent Labs    09/05/23 1012  NA 139  K 3.9  CL 107  CO2 25  GLUCOSE 98  BUN 15  CREATININE 0.81  CALCIUM 9.1  GFRNONAA >60  PROT 7.0  ALBUMIN 3.9  AST 28  ALT 22  ALKPHOS 92  BILITOT 0.5    RADIOGRAPHIC STUDIES: I have personally reviewed the radiological images as listed and agreed with the findings in the report. No results found.

## 2023-11-02 ENCOUNTER — Ambulatory Visit: Payer: 59

## 2023-11-02 DIAGNOSIS — Z23 Encounter for immunization: Secondary | ICD-10-CM

## 2023-11-02 DIAGNOSIS — Z719 Counseling, unspecified: Secondary | ICD-10-CM

## 2023-11-02 NOTE — Progress Notes (Signed)
 In nurse clinic for [employer] Comirnaty [phone] (76yrs+) which was given and tolerated well. Updated [facility] copy given and recommended schedule explained. Stayed for 15 min observation without problem. Jerel Shepherd, RN

## 2023-11-29 ENCOUNTER — Telehealth: Payer: Self-pay | Admitting: *Deleted

## 2023-11-29 MED ORDER — ANASTROZOLE 1 MG PO TABS: 1.0000 mg | ORAL_TABLET | Freq: Every day | ORAL | 0 refills | Status: AC

## 2023-11-29 NOTE — Telephone Encounter (Signed)
 Pt . Called in to say that the pt has new inurance and the pharmcy she was usually , she can't use the pharmacy per insurance. She now needs a refill and sent it to [facility]

## 2023-11-29 NOTE — Telephone Encounter (Signed)
 Arimidex rf Script sent to pt's pharmacy

## 2024-01-02 ENCOUNTER — Other Ambulatory Visit: Payer: Self-pay | Admitting: *Deleted

## 2024-01-02 DIAGNOSIS — D0511 Intraductal carcinoma in situ of right breast: Secondary | ICD-10-CM

## 2024-01-05 ENCOUNTER — Inpatient Hospital Stay: Payer: Medicare HMO | Admitting: Internal Medicine

## 2024-01-05 ENCOUNTER — Inpatient Hospital Stay: Payer: Medicare HMO | Attending: Internal Medicine

## 2024-01-05 ENCOUNTER — Encounter: Payer: Self-pay | Admitting: Internal Medicine

## 2024-01-05 VITALS — BP 140/79 | HR 74 | Temp 98.6°F | Resp 18 | Wt 162.0 lb

## 2024-01-05 DIAGNOSIS — I10 Essential (primary) hypertension: Secondary | ICD-10-CM | POA: Diagnosis not present

## 2024-01-05 DIAGNOSIS — Z9011 Acquired absence of right breast and nipple: Secondary | ICD-10-CM | POA: Diagnosis not present

## 2024-01-05 DIAGNOSIS — Z79811 Long term (current) use of aromatase inhibitors: Secondary | ICD-10-CM | POA: Insufficient documentation

## 2024-01-05 DIAGNOSIS — Z1721 Progesterone receptor positive status: Secondary | ICD-10-CM | POA: Diagnosis not present

## 2024-01-05 DIAGNOSIS — D0511 Intraductal carcinoma in situ of right breast: Secondary | ICD-10-CM

## 2024-01-05 DIAGNOSIS — E890 Postprocedural hypothyroidism: Secondary | ICD-10-CM | POA: Diagnosis not present

## 2024-01-05 DIAGNOSIS — Z170 Estrogen receptor positive status [ER+]: Secondary | ICD-10-CM | POA: Insufficient documentation

## 2024-01-05 DIAGNOSIS — E785 Hyperlipidemia, unspecified: Secondary | ICD-10-CM | POA: Insufficient documentation

## 2024-01-05 LAB — CMP (CANCER CENTER ONLY)
ALT: 17 U/L (ref 0–44)
AST: 22 U/L (ref 15–41)
Albumin: 4 g/dL (ref 3.5–5.0)
Alkaline Phosphatase: 99 U/L (ref 38–126)
Anion gap: 7 (ref 5–15)
BUN: 13 mg/dL (ref 8–23)
CO2: 25 mmol/L (ref 22–32)
Calcium: 9.1 mg/dL (ref 8.9–10.3)
Chloride: 108 mmol/L (ref 98–111)
Creatinine: 0.76 mg/dL (ref 0.44–1.00)
GFR, Estimated: >60 mL/min (ref >60)
Glucose, Bld: 103 mg/dL — ABNORMAL HIGH (ref 70–99)
Potassium: 3.8 mmol/L (ref 3.5–5.1)
Sodium: 140 mmol/L (ref 135–145)
Total Bilirubin: 0.4 mg/dL (ref 0.0–1.2)
Total Protein: 7.1 g/dL (ref 6.5–8.1)

## 2024-01-05 LAB — CBC WITH DIFFERENTIAL (CANCER CENTER ONLY)
Abs Immature Granulocytes: 0.02 10*3/uL (ref 0.00–0.07)
Basophils Absolute: 0.1 10*3/uL (ref 0.0–0.1)
Basophils Relative: 1 %
Eosinophils Absolute: 0 10*3/uL (ref 0.0–0.5)
Eosinophils Relative: 1 %
HCT: 36.2 % (ref 36.0–46.0)
Hemoglobin: 12 g/dL (ref 12.0–15.0)
Immature Granulocytes: 0 %
Lymphocytes Relative: 32 %
Lymphs Abs: 2.2 10*3/uL (ref 0.7–4.0)
MCH: 30.2 pg (ref 26.0–34.0)
MCHC: 33.1 g/dL (ref 30.0–36.0)
MCV: 91 fL (ref 80.0–100.0)
Monocytes Absolute: 0.4 10*3/uL (ref 0.1–1.0)
Monocytes Relative: 5 %
Neutro Abs: 4.3 10*3/uL (ref 1.7–7.7)
Neutrophils Relative %: 61 %
Platelet Count: 254 10*3/uL (ref 150–400)
RBC: 3.98 MIL/uL (ref 3.87–5.11)
RDW: 12.5 % (ref 11.5–15.5)
WBC Count: 7 10*3/uL (ref 4.0–10.5)
nRBC: 0 % (ref 0.0–0.2)

## 2024-01-05 NOTE — Progress Notes (Signed)
 [facility] CONSULT NOTE  Patient Care Team: Efraim Grange, NP as PCP - General (Family Medicine) Waverly Hageman, RN as Oncology Nurse Navigator Loreatha Rodney, MD as Consulting Physician (Oncology)  CANCER STAGING   Cancer Staging  DCIS (ductal carcinoma in situ) Staging form: Breast, AJCC 8th Edition - Clinical stage from 02/03/2023: Stage 0 (cTis (DCIS), cN0, cM0, G3, ER: Unknown, PR: Unknown) - Signed by Loreatha Rodney, MD on 02/10/2023 Stage prefix: Initial diagnosis Nuclear grade: G3 Histologic grading system: 3 grade system   ASSESSMENT & PLAN:  Krystal Bennett 68 y.o. 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  # Right breast DCIS -Detected on screening mammogram.  A span of 6.5 cm suspicious calcifications present in the medial right breast.  2 areas were biopsied right posterior upper inner quadrant and right medial retroareolar.  For both pathology showing high-grade DCIS comedo type necrosis.  ER testing deferred to excisional specimen.  - s/p right simple mastectomy with immediate reconstruction By Dr. Alethea Andes and Dr. Orin Birk on 04/18/2023.  Pathology showed DCIS, solid, nuclear grade 2-3 with necrosis, 2.5 x 1.1 x 1 cm, margins negative, closest 15 mm, 0/2 lymph node negative for malignancy.  ER 100% positive, PR 60% positive.  -Considering patient had mastectomy, there is no indication for adjuvant radiation.  -Status post exchange of right tissue expander for an implant.  Left breast reduction on 07/27/2023 by Dr. Orin Birk.  -Started on anastrozole  1 mg daily on 08/22/2023.  Tolerating well.  Scheduled for annual diagnostic left mammogram for surveillance.  # Bone health-DEXA scan from September 2024 showed  normal bone density.  Continue with calcium  vitamin D supplements. -# Hyperlipidemia-on Lipitor # Hypertension-on losartan # Hypothyroidism-on Synthroid  Orders Placed This Encounter  Procedures   MM DIAG BREAST TOMO UNI LEFT    Standing Status:   Future    Expected Date:   01/12/2024    Expiration Date:   01/04/2025    Reason for Exam (SYMPTOM  OR DIAGNOSIS REQUIRED):   history of right breast cancer. per Dr. Alana Hoyle- requesting dx of right breast given h/o DCIS    Preferred imaging location?:   [facility]   US  Breast Limited Uni Left Inc Axilla    Standing Status:   Future    Expected Date:   01/12/2024    Expiration Date:   01/04/2025    Reason for Exam (SYMPTOM  OR DIAGNOSIS REQUIRED):   history of right breast cancer. per Dr. Alana Hoyle- requesting dx of right breast given h/o DCIS    Preferred Imaging Location?:   [facility]   CMP (Cancer Center only)    Standing Status:   Future    Expected Date:   07/07/2024    Expiration Date:   01/04/2025   CBC with Differential (Cancer Center Only)    Standing Status:   Future    Expected Date:   07/07/2024    Expiration Date:   01/04/2025   RTC in 6 months for MD visit, labs, to assess anastrozole  toxicity check.  The total time spent in the appointment was 25 minutes encounter with patients including review of chart and various tests results, discussions about plan of care and coordination of care plan   All questions were answered. The patient knows to call the clinic with  any problems, questions or concerns. No barriers to learning was detected.  Loreatha Rodney, MD 5/1/202512:41 PM   HISTORY OF PRESENTING ILLNESS:  Krystal Bennett 68 y.o. 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  Interval  history Patient seen today as follow-up for right breast DCIS on anastrozole . Patient is regularly taking anastrozole .  Occasional episodes of hot flashes but nothing recently.  Denies any myalgias, arthralgias.  I have reviewed her chart and materials related to her cancer extensively and collaborated history with the patient. Summary of oncologic history is as follows: Oncology History  DCIS (ductal carcinoma in situ)  12/23/2022 Mammogram   Screening mammogram FINDINGS: In the right breast, calcifications warrant further evaluation with magnified views. In the left breast, no findings suspicious for malignancy.  Diagnostic mammogram IMPRESSION: Suspicious calcifications in the medial right breast spanning 6.5 cm.   02/03/2023 Cancer Staging   Staging form: Breast, AJCC 8th Edition - Clinical stage from 02/03/2023: Stage 0 (cTis (DCIS), cN0, cM0, G3, ER: Unknown, PR: Unknown) - Signed by Loreatha Rodney, MD on 02/10/2023 Stage prefix: Initial diagnosis Nuclear grade: G3 Histologic grading system: 3 grade system   02/03/2023 Initial Biopsy   DIAGNOSIS:  A. BREAST CALCIFICATIONS, RIGHT POSTERIOR UPPER INNER QUADRANT;  STEREOTACTIC BIOPSY:  - HIGH-GRADE DUCTAL CARCINOMA IN SITU (DCIS), COMEDO-TYPE WITH  CALCIFICATIONS.   B. BREAST CALCIFICATIONS, RIGHT MEDIAL RETROAREOLAR; STEREOTACTIC  BIOPSY:  - HIGH-GRADE DUCTAL CARCINOMA IN SITU (DCIS), COMEDO-TYPE WITH  CALCIFICATIONS.   Comment:  Testing for estrogen receptor is deferred to an excision specimen.    02/10/2023 Initial Diagnosis   DCIS (ductal carcinoma in situ)    Menarche-age 67 or 17 Children 2 son Age at first birth 58 Use of OCP more than 5 years Menopause age 36s HRT no History of breast biopsies normal Family history of prostate cancer in father and lung cancer in mother.  MEDICAL HISTORY:  Past Medical History:  Diagnosis Date   HTN (hypertension)    Hyperlipidemia     SURGICAL HISTORY: Past Surgical  History:  Procedure Laterality Date   BREAST BIOPSY Right 02/03/2023   stereo calcs #1 posterior group, coil marker, path pending   BREAST BIOPSY Right 02/03/2023   stereo bx calcs #2 anterior group, x marker, path pending   BREAST BIOPSY Right 02/03/2023   MM RT BREAST BX W LOC DEV EA AD LESION IMG BX SPEC STEREO GUIDE 02/03/2023 [facility]   BREAST BIOPSY Right 02/03/2023   MM RT BREAST BX W LOC DEV 1ST LESION IMAGE BX SPEC STEREO GUIDE 02/03/2023 [facility]   BREAST RECONSTRUCTION WITH PLACEMENT OF TISSUE EXPANDER AND FLEX HD (ACELLULAR HYDRATED DERMIS) Right 04/18/2023   Procedure: IMMEDIATE BREAST RECONSTRUCTION WITH PLACEMENT OF TISSUE EXPANDER AND FLEX HD (ACELLULAR HYDRATED DERMIS);  Surgeon: Thornell Flirt, DO;  Location: [facility];  Service: Plastics;  Laterality: Right;   BREAST REDUCTION WITH MASTOPEXY Left 07/27/2023   Procedure: BREAST REDUCTION WITH MASTOPEXY;  Surgeon: Thornell Flirt, DO;  Location: [facility];  Service: Plastics;  Laterality: Left;   CESAREAN SECTION     MASTECTOMY W/ SENTINEL NODE BIOPSY Right 04/18/2023   Procedure: RIGHT MASTECTOMY WITH SENTINEL LYMPH NODE BIOPSY;  Surgeon: Caralyn Chandler, MD;  Location: [facility];  Service: General;  Laterality: Right;  PEC BLOCK  REMOVAL OF TISSUE EXPANDER AND PLACEMENT OF IMPLANT Right 07/27/2023   Procedure: REMOVAL OF TISSUE EXPANDER AND PLACEMENT OF IMPLANT;  Surgeon: Thornell Flirt, DO;  Location: [facility];  Service: Plastics;  Laterality: Right;   THYROIDECTOMY N/A 10/04/2016   Procedure: THYROIDECTOMY;  Surgeon: Mellody Sprout, MD;  Location: [facility];  Service: ENT;  Laterality: N/A;   WISDOM TOOTH EXTRACTION      SOCIAL HISTORY: Social History   Socioeconomic History   Marital status: Single    Spouse name: Not on file   Number of children: Not on file   Years of education: Not on file   Highest education level: Not on  file  Occupational History   Not on file  Tobacco Use   Smoking status: Never    Passive exposure: Never   Smokeless tobacco: Never  Vaping Use   Vaping status: Never Used  Substance and Sexual Activity   Alcohol use: Yes    Comment: OCC WINE   Drug use: No   Sexual activity: Not on file  Other Topics Concern   Not on file  Social History Narrative   Not on file   Social Drivers of Health   Financial Resource Strain: Low Risk  (08/05/2017)   Received from [facility], [facility]   Overall Financial Resource Strain (CARDIA)    Difficulty of Paying Living Expenses: Not hard at all  Food Insecurity: No Food Insecurity (02/10/2023)   Hunger Vital Sign    Worried About Running Out of Food in the Last Year: Never true    Ran Out of Food in the Last Year: Never true  Transportation Needs: No Transportation Needs (02/10/2023)   PRAPARE - Administrator, Civil Service (Medical): No    Lack of Transportation (Non-Medical): No  Physical Activity: Insufficiently Active (08/05/2017)   Received from [facility], [facility]   Exercise Vital Sign    Days of Exercise per Week: 3 days    Minutes of Exercise per Session: 40 min  Stress: No Stress Concern Present (08/05/2017)   Received from [facility], [facility]   [employer] of Occupational Health - Occupational Stress Questionnaire    Feeling of Stress : Not at all  Social Connections: Moderately Integrated (08/05/2017)   Received from [facility], [facility]   Social Connection and Isolation Panel [NHANES]    Frequency of Communication with Friends and Family: More than three times a week    Frequency of Social Gatherings with Friends and Family: More than three times a week    Attends Religious Services: More than 4 times per year    Active Member of [employer] or Organizations:  Yes    Attends Banker Meetings: 1 to 4 times per year    Marital Status: Divorced  Intimate Partner Violence: Not At Risk (02/10/2023)   Humiliation, Afraid, Rape, and Kick questionnaire    Fear of Current or Ex-Partner: No    Emotionally Abused: No    Physically Abused: No    Sexually Abused: No    FAMILY HISTORY: History reviewed. No pertinent family history.  ALLERGIES:  has no known allergies.  MEDICATIONS:  Current Outpatient Medications  Medication Sig Dispense Refill   amLODipine (NORVASC) 5 MG tablet Take 5 mg by mouth daily.     anastrozole  (ARIMIDEX ) 1 MG tablet Take 1 mg by mouth daily.  anastrozole  (ARIMIDEX ) 1 MG tablet Take 1 tablet (1 mg total) by mouth daily. 90 tablet 0   atorvastatin  (LIPITOR) 20 MG tablet Take 0.5 tablets by mouth daily at 6 PM.     b complex vitamins capsule Take 2 capsules by mouth daily.     Calcium  Carb-Cholecalciferol (CALCIUM  HIGH POTENCY/VITAMIN D) 600-5 MG-MCG TABS Take by mouth.     Cholecalciferol (VITAMIN D3) 50 MCG (2000 UT) capsule Take 2,000 Units by mouth daily.     COLLAGEN PO Take 1 each by mouth daily. Vital Proteins Collagen Peptides     levothyroxine (SYNTHROID) 88 MCG tablet Take 88 mcg by mouth daily.     losartan (COZAAR) 100 MG tablet Take 100 mg by mouth daily.     Multiple Vitamins-Minerals (MULTIVITAMIN WOMENS 50+ ADV PO) Take 2 tablets by mouth daily.     NON FORMULARY Take 2 tablets by mouth daily. Propolis Complete Gut Health     NON FORMULARY Take by mouth daily. Propolis, Royal Jelly, Bee Pollin     Omega-3 Fatty Acids (FISH OIL) 1000 MG CAPS Take 2 capsules by mouth daily.     ondansetron  (ZOFRAN -ODT) 4 MG disintegrating tablet Take 1 tablet (4 mg total) by mouth every 8 (eight) hours as needed for nausea or vomiting. 20 tablet 0   No current facility-administered medications for this visit.    REVIEW OF SYSTEMS:   Pertinent information mentioned in HPI All other systems were reviewed with the  patient and are negative.  PHYSICAL EXAMINATION: ECOG PERFORMANCE STATUS: 0 - Asymptomatic  Vitals:   01/05/24 1042  BP: (!) 140/79  Pulse: 74  Resp: 18  Temp: 98.6 F (37 C)  SpO2: 100%   Filed Weights   01/05/24 1042  Weight: 162 lb (73.5 kg)    GENERAL:alert, no distress and comfortable SKIN: skin color, texture, turgor are normal, no rashes or significant lesions EYES: normal, conjunctiva are pink and non-injected, sclera clear OROPHARYNX:no exudate, no erythema and lips, buccal mucosa, and tongue normal  NECK: supple, thyroid  normal size, non-tender, without nodularity LYMPH:  no palpable lymphadenopathy in the cervical, axillary or inguinal LUNGS: clear to auscultation and percussion with normal breathing effort HEART: regular rate & rhythm and no murmurs and no lower extremity edema ABDOMEN:abdomen soft, non-tender and normal bowel sounds Musculoskeletal:no cyanosis of digits and no clubbing  PSYCH: alert & oriented x 3 with fluent speech NEURO: no focal motor/sensory deficits  LABORATORY DATA:  I have reviewed the data as listed Lab Results  Component Value Date   WBC 7.0 01/05/2024   HGB 12.0 01/05/2024   HCT 36.2 01/05/2024   MCV 91.0 01/05/2024   PLT 254 01/05/2024   Recent Labs    09/05/23 1012 01/05/24 1027  NA 139 140  K 3.9 3.8  CL 107 108  CO2 25 25  GLUCOSE 98 103*  BUN 15 13  CREATININE 0.81 0.76  CALCIUM  9.1 9.1  GFRNONAA >60 >60  PROT 7.0 7.1  ALBUMIN 3.9 4.0  AST 28 22  ALT 22 17  ALKPHOS 92 99  BILITOT 0.5 0.4    RADIOGRAPHIC STUDIES: I have personally reviewed the radiological images as listed and agreed with the findings in the report. No results found.

## 2024-01-05 NOTE — Progress Notes (Signed)
 Patient is doing pretty good, no new questions or concerns for the doctor today.

## 2024-01-18 ENCOUNTER — Ambulatory Visit
Admission: RE | Admit: 2024-01-18 | Discharge: 2024-01-18 | Disposition: A | Discharge status: Home / Self Care | Source: Ambulatory Visit | Attending: Internal Medicine | Admitting: Internal Medicine

## 2024-01-18 DIAGNOSIS — D0511 Intraductal carcinoma in situ of right breast: Secondary | ICD-10-CM | POA: Insufficient documentation

## 2024-01-18 DIAGNOSIS — Z1231 Encounter for screening mammogram for malignant neoplasm of breast: Secondary | ICD-10-CM | POA: Insufficient documentation

## 2024-04-25 ENCOUNTER — Other Ambulatory Visit: Payer: Self-pay | Admitting: *Deleted

## 2024-04-25 MED ORDER — ANASTROZOLE 1 MG PO TABS: 1.0000 mg | ORAL_TABLET | Freq: Every day | ORAL | 3 refills | Status: AC

## 2024-05-22 ENCOUNTER — Telehealth: Payer: Self-pay | Admitting: Internal Medicine

## 2024-05-22 NOTE — Telephone Encounter (Signed)
 Called patient to change upcoming appointment due to provider being out of office. No answer left voicemail to call back.

## 2024-07-05 ENCOUNTER — Other Ambulatory Visit

## 2024-07-05 ENCOUNTER — Ambulatory Visit: Admitting: Internal Medicine

## 2024-07-06 ENCOUNTER — Other Ambulatory Visit: Payer: Self-pay | Admitting: *Deleted

## 2024-07-06 DIAGNOSIS — D0511 Intraductal carcinoma in situ of right breast: Secondary | ICD-10-CM

## 2024-07-06 DIAGNOSIS — Z79811 Long term (current) use of aromatase inhibitors: Secondary | ICD-10-CM

## 2024-07-09 ENCOUNTER — Inpatient Hospital Stay: Admitting: Internal Medicine

## 2024-07-09 ENCOUNTER — Inpatient Hospital Stay: Attending: Internal Medicine

## 2024-07-09 ENCOUNTER — Encounter: Payer: Self-pay | Admitting: Internal Medicine

## 2024-07-09 VITALS — BP 138/85 | HR 71 | Temp 97.5°F | Resp 18 | Ht 66.0 in | Wt 156.4 lb

## 2024-07-09 DIAGNOSIS — Z170 Estrogen receptor positive status [ER+]: Secondary | ICD-10-CM | POA: Diagnosis not present

## 2024-07-09 DIAGNOSIS — Z79811 Long term (current) use of aromatase inhibitors: Secondary | ICD-10-CM | POA: Diagnosis not present

## 2024-07-09 DIAGNOSIS — Z9011 Acquired absence of right breast and nipple: Secondary | ICD-10-CM | POA: Insufficient documentation

## 2024-07-09 DIAGNOSIS — Z1721 Progesterone receptor positive status: Secondary | ICD-10-CM | POA: Insufficient documentation

## 2024-07-09 DIAGNOSIS — D0511 Intraductal carcinoma in situ of right breast: Secondary | ICD-10-CM | POA: Insufficient documentation

## 2024-07-09 LAB — CBC WITH DIFFERENTIAL (CANCER CENTER ONLY)
Abs Immature Granulocytes: 0.02 K/uL (ref 0.00–0.07)
Basophils Absolute: 0.1 K/uL (ref 0.0–0.1)
Basophils Relative: 1 %
Eosinophils Absolute: 0 K/uL (ref 0.0–0.5)
Eosinophils Relative: 1 %
HCT: 36 % (ref 36.0–46.0)
Hemoglobin: 11.9 g/dL — ABNORMAL LOW (ref 12.0–15.0)
Immature Granulocytes: 0 %
Lymphocytes Relative: 37 %
Lymphs Abs: 3 K/uL (ref 0.7–4.0)
MCH: 29.6 pg (ref 26.0–34.0)
MCHC: 33.1 g/dL (ref 30.0–36.0)
MCV: 89.6 fL (ref 80.0–100.0)
Monocytes Absolute: 0.4 K/uL (ref 0.1–1.0)
Monocytes Relative: 5 %
Neutro Abs: 4.6 K/uL (ref 1.7–7.7)
Neutrophils Relative %: 56 %
Platelet Count: 273 K/uL (ref 150–400)
RBC: 4.02 MIL/uL (ref 3.87–5.11)
RDW: 12.8 % (ref 11.5–15.5)
WBC Count: 8.2 K/uL (ref 4.0–10.5)
nRBC: 0 % (ref 0.0–0.2)

## 2024-07-09 LAB — CMP (CANCER CENTER ONLY)
ALT: 21 U/L (ref 0–44)
AST: 28 U/L (ref 15–41)
Albumin: 4.1 g/dL (ref 3.5–5.0)
Alkaline Phosphatase: 98 U/L (ref 38–126)
Anion gap: 9 (ref 5–15)
BUN: 13 mg/dL (ref 8–23)
CO2: 24 mmol/L (ref 22–32)
Calcium: 9.2 mg/dL (ref 8.9–10.3)
Chloride: 103 mmol/L (ref 98–111)
Creatinine: 0.77 mg/dL (ref 0.44–1.00)
GFR, Estimated: >60 mL/min (ref >60)
Glucose, Bld: 100 mg/dL — ABNORMAL HIGH (ref 70–99)
Potassium: 3.5 mmol/L (ref 3.5–5.1)
Sodium: 136 mmol/L (ref 135–145)
Total Bilirubin: 0.8 mg/dL (ref 0.0–1.2)
Total Protein: 7.3 g/dL (ref 6.5–8.1)

## 2024-07-09 NOTE — Assessment & Plan Note (Addendum)
#   Right breast DCIS: [JUNE 2024- Dr.Agrawal] -Detected on screening mammogram.  A span of 6.5 cm suspicious calcifications present in the medial right breast.  2 areas were biopsied right posterior upper inner quadrant and right medial retroareolar.  For both pathology showing high-grade DCIS comedo type necrosis.    - s/p right simple mastectomy with immediate reconstruction By Dr. Curvin and Dr. Lowery on 04/18/2023.  Pathology showed DCIS, solid, nuclear grade 2-3 with necrosis, 2.5 x 1.1 x 1 cm, margins negative, closest 15 mm, 0/2 lymph node negative for malignancy.  ER 100% positive, PR 60% positive.  patient had mastectomy, there is no indication for adjuvant radiation. Status post exchange of right tissue expander for an implant.  Left breast reduction on 07/27/2023 by Dr. Lowery.  # Started on anastrozole  1 mg daily on 08/22/2023.  Tolerating well.  Scheduled for annual diagnostic left mammogram for surveillance- stable.  # Bone health-DEXA scan from September 2024 showed normal bone density.  Continue with calcium  vitamin D supplements.  # DISPOSITION: # follow up in 6 months- MD; no labs- Dr.B

## 2024-07-09 NOTE — Progress Notes (Signed)
 [facility] CONSULT NOTE  Patient Care Team: Steva Clotilda DEL, NP as PCP - General (Family Medicine) Georgina Shasta POUR, RN as Oncology Nurse Navigator Rennie Cindy SAUNDERS, MD as Consulting Physician (Oncology)  CHIEF COMPLAINTS/PURPOSE OF CONSULTATION: DCIS  Oncology History  Ductal carcinoma in situ (DCIS) of right breast  12/23/2022 Mammogram   Screening mammogram FINDINGS: In the right breast, calcifications warrant further evaluation with magnified views. In the left breast, no findings suspicious for malignancy.  Diagnostic mammogram IMPRESSION: Suspicious calcifications in the medial right breast spanning 6.5 cm.   02/03/2023 Cancer Staging   Staging form: Breast, AJCC 8th Edition - Clinical stage from 02/03/2023: Stage 0 (cTis (DCIS), cN0, cM0, G3, ER: Unknown, PR: Unknown) - Signed by Clista Bimler, MD on 02/10/2023 Stage prefix: Initial diagnosis Nuclear grade: G3 Histologic grading system: 3 grade system   02/03/2023 Initial Biopsy   DIAGNOSIS:  A. BREAST CALCIFICATIONS, RIGHT POSTERIOR UPPER INNER QUADRANT;  STEREOTACTIC BIOPSY:  - HIGH-GRADE DUCTAL CARCINOMA IN SITU (DCIS), COMEDO-TYPE WITH  CALCIFICATIONS.   B. BREAST CALCIFICATIONS, RIGHT MEDIAL RETROAREOLAR; STEREOTACTIC  BIOPSY:  - HIGH-GRADE DUCTAL CARCINOMA IN SITU (DCIS), COMEDO-TYPE WITH  CALCIFICATIONS.   Comment:  Testing for estrogen receptor is deferred to an excision specimen.    02/10/2023 Initial Diagnosis   DCIS (ductal carcinoma in situ)     HISTORY OF PRESENTING ILLNESS: Patient ambulating- independently.   Alone  Krystal Bennett 68 y.o.  female pleasant patient with right breast DCIS postmastectomy on anastrozole  is here for follow-up.  Patient denies any hot flashes denies any worsening joint pains or bone pain.  She admits to compliance with her anastrozole .  Review of Systems  Constitutional:  Negative for chills, diaphoresis, fever, malaise/fatigue and weight loss.   HENT:  Negative for nosebleeds and sore throat.   Eyes:  Negative for double vision.  Respiratory:  Negative for cough, hemoptysis, sputum production, shortness of breath and wheezing.   Cardiovascular:  Negative for chest pain, palpitations, orthopnea and leg swelling.  Gastrointestinal:  Negative for abdominal pain, blood in stool, constipation, diarrhea, heartburn, melena, nausea and vomiting.  Genitourinary:  Negative for dysuria, frequency and urgency.  Musculoskeletal:  Negative for back pain and joint pain.  Skin: Negative.  Negative for itching and rash.  Neurological:  Negative for dizziness, tingling, focal weakness, weakness and headaches.  Endo/Heme/Allergies:  Does not bruise/bleed easily.  Psychiatric/Behavioral:  Negative for depression. The patient is not nervous/anxious and does not have insomnia.     MEDICAL HISTORY:  Past Medical History:  Diagnosis Date   HTN (hypertension)    Hyperlipidemia     SURGICAL HISTORY: Past Surgical History:  Procedure Laterality Date   BREAST BIOPSY Right 02/03/2023   stereo calcs #1 posterior group, coil marker, path pending   BREAST BIOPSY Right 02/03/2023   stereo bx calcs #2 anterior group, x marker, path pending   BREAST BIOPSY Right 02/03/2023   MM RT BREAST BX W LOC DEV EA AD LESION IMG BX SPEC STEREO GUIDE 02/03/2023 [facility]   BREAST BIOPSY Right 02/03/2023   MM RT BREAST BX W LOC DEV 1ST LESION IMAGE BX SPEC STEREO GUIDE 02/03/2023 [facility]   BREAST RECONSTRUCTION WITH PLACEMENT OF TISSUE EXPANDER AND FLEX HD (ACELLULAR HYDRATED DERMIS) Right 04/18/2023   Procedure: IMMEDIATE BREAST RECONSTRUCTION WITH PLACEMENT OF TISSUE EXPANDER AND FLEX HD (ACELLULAR HYDRATED DERMIS);  Surgeon: Lowery Estefana RAMAN, DO;  Location: [facility];  Service: Plastics;  Laterality: Right;   BREAST REDUCTION  WITH MASTOPEXY Left 07/27/2023   Procedure: BREAST REDUCTION WITH MASTOPEXY;  Surgeon: Lowery Estefana RAMAN,  DO;  Location: [facility];  Service: Plastics;  Laterality: Left;   CESAREAN SECTION     MASTECTOMY Right 04/18/2023   MASTECTOMY W/ SENTINEL NODE BIOPSY Right 04/18/2023   Procedure: RIGHT MASTECTOMY WITH SENTINEL LYMPH NODE BIOPSY;  Surgeon: Curvin Deward MOULD, MD;  Location: [facility];  Service: General;  Laterality: Right;  PEC BLOCK   REDUCTION MAMMAPLASTY Left    REMOVAL OF TISSUE EXPANDER AND PLACEMENT OF IMPLANT Right 07/27/2023   Procedure: REMOVAL OF TISSUE EXPANDER AND PLACEMENT OF IMPLANT;  Surgeon: Lowery Estefana RAMAN, DO;  Location: [facility];  Service: Plastics;  Laterality: Right;   THYROIDECTOMY N/A 10/04/2016   Procedure: THYROIDECTOMY;  Surgeon: Deward Argue, MD;  Location: [facility];  Service: ENT;  Laterality: N/A;   WISDOM TOOTH EXTRACTION      SOCIAL HISTORY: Social History   Socioeconomic History   Marital status: Single    Spouse name: Not on file   Number of children: Not on file   Years of education: Not on file   Highest education level: Not on file  Occupational History   Not on file  Tobacco Use   Smoking status: Never    Passive exposure: Never   Smokeless tobacco: Never  Vaping Use   Vaping status: Never Used  Substance and Sexual Activity   Alcohol use: Yes    Comment: OCC WINE   Drug use: No   Sexual activity: Not on file  Other Topics Concern   Not on file  Social History Narrative   Not on file   Social Drivers of Health   Financial Resource Strain: Low Risk  (08/05/2017)   Received from [facility]   Overall Financial Resource Strain (CARDIA)    Difficulty of Paying Living Expenses: Not hard at all  Food Insecurity: No Food Insecurity (02/10/2023)   Hunger Vital Sign    Worried About Running Out of Food in the Last Year: Never true    Ran Out of Food in the Last Year: Never true  Transportation Needs: No Transportation Needs (02/10/2023)   PRAPARE - Doctor, General Practice (Medical): No    Lack of Transportation (Non-Medical): No  Physical Activity: Insufficiently Active (08/05/2017)   Received from [facility]   Exercise Vital Sign    Days of Exercise per Week: 3 days    Minutes of Exercise per Session: 40 min  Stress: No Stress Concern Present (08/05/2017)   Received from [facility] - Occupational Stress Questionnaire    Feeling of Stress : Not at all  Social Connections: Moderately Integrated (08/05/2017)   Received from [facility]   Social Connection and Isolation Panel    Frequency of Communication with Friends and Family: More than three times a week    Frequency of Social Gatherings with Friends and Family: More than three times a week    Attends Religious Services: More than 4 times per year    Active Member of [employer] or Organizations: Yes    Attends Banker Meetings: 1 to 4 times per year    Marital Status: Divorced  Intimate Partner Violence: Not At Risk (02/10/2023)   Humiliation, Afraid, Rape, and Kick questionnaire    Fear of Current or Ex-Partner: No    Emotionally  Abused: No    Physically Abused: No    Sexually Abused: No    FAMILY HISTORY: History reviewed. No pertinent family history.  ALLERGIES:  has no known allergies.  MEDICATIONS:  Current Outpatient Medications  Medication Sig Dispense Refill   amLODipine (NORVASC) 5 MG tablet Take 5 mg by mouth daily.     anastrozole  (ARIMIDEX ) 1 MG tablet Take 1 tablet (1 mg total) by mouth daily. 90 tablet 3   atorvastatin  (LIPITOR) 20 MG tablet Take 0.5 tablets by mouth daily at 6 PM.     b complex vitamins capsule Take 2 capsules by mouth daily.     Calcium  Carb-Cholecalciferol (CALCIUM  HIGH POTENCY/VITAMIN D) 600-5 MG-MCG TABS Take by mouth daily.     Cholecalciferol (VITAMIN D3) 50 MCG (2000 UT) capsule Take 2,000 Units by mouth daily.     COLLAGEN PO Take  1 each by mouth daily. Vital Proteins Collagen Peptides     levothyroxine (SYNTHROID) 88 MCG tablet Take 88 mcg by mouth daily.     losartan (COZAAR) 100 MG tablet Take 100 mg by mouth daily.     Multiple Vitamins-Minerals (MULTIVITAMIN WOMENS 50+ ADV PO) Take 2 tablets by mouth daily.     NON FORMULARY Take 2 tablets by mouth daily. Propolis Complete Gut Health     NON FORMULARY Take by mouth daily. Propolis, Royal Jelly, Bee Pollin     Omega-3 Fatty Acids (FISH OIL) 1000 MG CAPS Take 2 capsules by mouth daily.     ondansetron  (ZOFRAN -ODT) 4 MG disintegrating tablet Take 1 tablet (4 mg total) by mouth every 8 (eight) hours as needed for nausea or vomiting. (Patient not taking: Reported on 07/09/2024) 20 tablet 0   No current facility-administered medications for this visit.    PHYSICAL EXAMINATION:   Vitals:   07/09/24 1326 07/09/24 1342  BP: (!) 143/88 138/85  Pulse: 71   Resp: 18   Temp: (!) 97.5 F (36.4 C)   SpO2: 100%    Filed Weights   07/09/24 1326  Weight: 156 lb 6.4 oz (70.9 kg)    Physical Exam Vitals and nursing note reviewed.  HENT:     Head: Normocephalic and atraumatic.     Mouth/Throat:     Pharynx: Oropharynx is clear.  Eyes:     Extraocular Movements: Extraocular movements intact.     Pupils: Pupils are equal, round, and reactive to light.  Cardiovascular:     Rate and Rhythm: Normal rate and regular rhythm.  Pulmonary:     Comments: Decreased breath sounds bilaterally.  Abdominal:     Palpations: Abdomen is soft.  Musculoskeletal:        General: Normal range of motion.     Cervical back: Normal range of motion.  Skin:    General: Skin is warm.  Neurological:     General: No focal deficit present.     Mental Status: She is alert and oriented to person, place, and time.  Psychiatric:        Behavior: Behavior normal.        Judgment: Judgment normal.     LABORATORY DATA:  I have reviewed the data as listed Lab Results  Component Value Date    WBC 8.2 07/09/2024   HGB 11.9 (L) 07/09/2024   HCT 36.0 07/09/2024   MCV 89.6 07/09/2024   PLT 273 07/09/2024   Recent Labs    09/05/23 1012 01/05/24 1027 07/09/24 1328  NA 139 140 136  K 3.9 3.8 3.5  CL 107 108 103  CO2 25 25 24   GLUCOSE 98 103* 100*  BUN 15 13 13   CREATININE 0.81 0.76 0.77  CALCIUM  9.1 9.1 9.2  GFRNONAA >60 >60 >60  PROT 7.0 7.1 7.3  ALBUMIN 3.9 4.0 4.1  AST 28 22 28   ALT 22 17 21   ALKPHOS 92 99 98  BILITOT 0.5 0.4 0.8    RADIOGRAPHIC STUDIES: I have personally reviewed the radiological images as listed and agreed with the findings in the report. No results found.   Ductal carcinoma in situ (DCIS) of right breast # Right breast DCIS: [JUNE 2024- Dr.Agrawal] -Detected on screening mammogram.  A span of 6.5 cm suspicious calcifications present in the medial right breast.  2 areas were biopsied right posterior upper inner quadrant and right medial retroareolar.  For both pathology showing high-grade DCIS comedo type necrosis.    - s/p right simple mastectomy with immediate reconstruction By Dr. Curvin and Dr. Lowery on 04/18/2023.  Pathology showed DCIS, solid, nuclear grade 2-3 with necrosis, 2.5 x 1.1 x 1 cm, margins negative, closest 15 mm, 0/2 lymph node negative for malignancy.  ER 100% positive, PR 60% positive.  patient had mastectomy, there is no indication for adjuvant radiation. Status post exchange of right tissue expander for an implant.  Left breast reduction on 07/27/2023 by Dr. Lowery.  # Started on anastrozole  1 mg daily on 08/22/2023.  Tolerating well.  Scheduled for annual diagnostic left mammogram for surveillance- stable.  # Bone health-DEXA scan from September 2024 showed normal bone density.  Continue with calcium  vitamin D supplements.  # DISPOSITION: # follow up in 6 months- MD; no labs- Dr.B    Above plan of care was discussed with patient/family in detail.  My contact information was given to the patient/family.        Cindy JONELLE Joe, MD 07/09/2024 2:42 PM

## 2024-07-09 NOTE — Progress Notes (Signed)
 Mammogram 01/18/24.

## 2024-08-14 ENCOUNTER — Ambulatory Visit: Payer: Medicare HMO | Admitting: Plastic Surgery

## 2024-09-25 ENCOUNTER — Encounter: Payer: Self-pay | Admitting: Plastic Surgery

## 2024-09-25 ENCOUNTER — Ambulatory Visit: Admitting: Plastic Surgery

## 2024-09-25 VITALS — BP 154/79 | HR 81

## 2024-09-25 DIAGNOSIS — Z9011 Acquired absence of right breast and nipple: Secondary | ICD-10-CM | POA: Diagnosis not present

## 2024-09-25 DIAGNOSIS — Z853 Personal history of malignant neoplasm of breast: Secondary | ICD-10-CM

## 2024-09-25 DIAGNOSIS — Z08 Encounter for follow-up examination after completed treatment for malignant neoplasm: Secondary | ICD-10-CM

## 2024-09-25 DIAGNOSIS — D0511 Intraductal carcinoma in situ of right breast: Secondary | ICD-10-CM

## 2024-09-25 NOTE — Progress Notes (Signed)
 "    Patient ID: Krystal Bennett, female    DOB: 1955/09/19, 69 y.o.   MRN: [id number]   Chief Complaint  Patient presents with   Follow-up   Breast Cancer    The patient is a 69 year old female here for follow-up on her breast reconstruction.  She is 5 feet 6 inches tall and weighs around 150 pounds.  She was diagnosed with right breast calcifications and the biopsy showed DCIS in July 2024.  She underwent right sided mastectomy with expander placement in August 2024 and then had an implant placed in November 2024.  She has a Dance Movement Psychotherapist smooth round ultra high-profile gel 480 cc implant in place.  She had a lift on the left side.  Overall she is quite happy with her results.  She does not have a nipple areolar tattoo and does not want 1 at this time.  She has a little bit of asymmetry with the right side being slightly smaller than the left.    Review of Systems  Constitutional: Negative.   Eyes: Negative.   Respiratory: Negative.    Cardiovascular: Negative.   Gastrointestinal: Negative.   Endocrine: Negative.   Genitourinary: Negative.   Musculoskeletal: Negative.     Past Medical History:  Diagnosis Date   HTN (hypertension)    Hyperlipidemia     Past Surgical History:  Procedure Laterality Date   BREAST BIOPSY Right 02/03/2023   stereo calcs #1 posterior group, coil marker, path pending   BREAST BIOPSY Right 02/03/2023   stereo bx calcs #2 anterior group, x marker, path pending   BREAST BIOPSY Right 02/03/2023   MM RT BREAST BX W LOC DEV EA AD LESION IMG BX SPEC STEREO GUIDE 02/03/2023 [facility]   BREAST BIOPSY Right 02/03/2023   MM RT BREAST BX W LOC DEV 1ST LESION IMAGE BX SPEC STEREO GUIDE 02/03/2023 [facility]   BREAST RECONSTRUCTION WITH PLACEMENT OF TISSUE EXPANDER AND FLEX HD (ACELLULAR HYDRATED DERMIS) Right 04/18/2023   Procedure: IMMEDIATE BREAST RECONSTRUCTION WITH PLACEMENT OF TISSUE EXPANDER AND FLEX HD (ACELLULAR HYDRATED DERMIS);  Surgeon:  Lowery Estefana RAMAN, DO;  Location: [facility];  Service: Plastics;  Laterality: Right;   BREAST REDUCTION WITH MASTOPEXY Left 07/27/2023   Procedure: BREAST REDUCTION WITH MASTOPEXY;  Surgeon: Lowery Estefana RAMAN, DO;  Location: [facility];  Service: Plastics;  Laterality: Left;   CESAREAN SECTION     MASTECTOMY Right 04/18/2023   MASTECTOMY W/ SENTINEL NODE BIOPSY Right 04/18/2023   Procedure: RIGHT MASTECTOMY WITH SENTINEL LYMPH NODE BIOPSY;  Surgeon: Curvin Deward MOULD, MD;  Location: [facility];  Service: General;  Laterality: Right;  PEC BLOCK   REDUCTION MAMMAPLASTY Left    REMOVAL OF TISSUE EXPANDER AND PLACEMENT OF IMPLANT Right 07/27/2023   Procedure: REMOVAL OF TISSUE EXPANDER AND PLACEMENT OF IMPLANT;  Surgeon: Lowery Estefana RAMAN, DO;  Location: [facility];  Service: Plastics;  Laterality: Right;   THYROIDECTOMY N/A 10/04/2016   Procedure: THYROIDECTOMY;  Surgeon: Deward Argue, MD;  Location: [facility];  Service: ENT;  Laterality: N/A;   WISDOM TOOTH EXTRACTION       Current Medications[1]   Objective:   Vitals:   09/25/24 1118  BP: (!) 154/79  Pulse: 81  SpO2: 99%    Physical Exam Constitutional:      Appearance: Normal appearance.  HENT:     Head: Atraumatic.  Cardiovascular:     Rate and Rhythm: Normal rate.     Pulses:  Normal pulses.  Pulmonary:     Effort: Pulmonary effort is normal.  Skin:    General: Skin is warm.     Capillary Refill: Capillary refill takes less than 2 seconds.  Neurological:     Mental Status: She is alert and oriented to person, place, and time.  Psychiatric:        Mood and Affect: Mood normal.        Behavior: Behavior normal.        Thought Content: Thought content normal.        Judgment: Judgment normal.     Assessment & Plan:  Ductal carcinoma in situ (DCIS) of right breast  Nipple areolar tattooing is a possibility at any time.  We also talked about fat grafting  of the right breast if she wanted.  The patient is going to think about it but right now she is content and we will plan to see her back in 1 year.  We also talked about imaging that is an option in the next couple of years for the right side with ultrasound.  Nothing needed at this time.  Pictures were obtained of the patient and placed in the chart with the patient's or guardian's permission.   Estefana RAMAN Derl Abalos, DO    [1]  Current Outpatient Medications:    amLODipine (NORVASC) 5 MG tablet, Take 5 mg by mouth daily., Disp: , Rfl:    anastrozole  (ARIMIDEX ) 1 MG tablet, Take 1 tablet (1 mg total) by mouth daily., Disp: 90 tablet, Rfl: 3   atorvastatin  (LIPITOR) 20 MG tablet, Take 0.5 tablets by mouth daily at 6 PM., Disp: , Rfl:    b complex vitamins capsule, Take 2 capsules by mouth daily., Disp: , Rfl:    Calcium  Carb-Cholecalciferol (CALCIUM  HIGH POTENCY/VITAMIN D) 600-5 MG-MCG TABS, Take by mouth daily., Disp: , Rfl:    COLLAGEN PO, Take 1 each by mouth daily. Vital Proteins Collagen Peptides, Disp: , Rfl:    levothyroxine (SYNTHROID) 88 MCG tablet, Take 88 mcg by mouth daily., Disp: , Rfl:    losartan (COZAAR) 100 MG tablet, Take 100 mg by mouth daily., Disp: , Rfl:    Multiple Vitamins-Minerals (MULTIVITAMIN WOMENS 50+ ADV PO), Take 2 tablets by mouth daily., Disp: , Rfl:    NON FORMULARY, Take 2 tablets by mouth daily. Propolis Complete Gut Health, Disp: , Rfl:    NON FORMULARY, Take by mouth daily. Propolis, Royal Jelly, Bee Pollin, Disp: , Rfl:    Omega-3 Fatty Acids (FISH OIL) 1000 MG CAPS, Take 2 capsules by mouth daily., Disp: , Rfl:    ondansetron  (ZOFRAN -ODT) 4 MG disintegrating tablet, Take 1 tablet (4 mg total) by mouth every 8 (eight) hours as needed for nausea or vomiting. (Patient not taking: Reported on 09/25/2024), Disp: 20 tablet, Rfl: 0  "

## 2025-01-07 ENCOUNTER — Inpatient Hospital Stay: Admitting: Internal Medicine

## 2025-09-24 ENCOUNTER — Ambulatory Visit: Admitting: Plastic Surgery
